# Patient Record
Sex: Female | Born: 1935 | Race: Black or African American | Hispanic: No | Smoking: Never smoker
Health system: Southern US, Community
[De-identification: ages and names within clinical notes are randomized; demographics above are authoritative.]

## PROBLEM LIST (undated history)

## (undated) DIAGNOSIS — I10 Essential (primary) hypertension: Secondary | ICD-10-CM

---

## 2001-05-01 ENCOUNTER — Emergency Department (HOSPITAL_COMMUNITY): Admission: EM | Admit: 2001-05-01 | Discharge: 2001-05-01 | Payer: Self-pay

## 2001-05-26 ENCOUNTER — Encounter: Admission: RE | Admit: 2001-05-26 | Discharge: 2001-06-25 | Payer: Self-pay | Admitting: Orthopedic Surgery

## 2010-06-25 ENCOUNTER — Emergency Department (HOSPITAL_COMMUNITY)
Admission: EM | Admit: 2010-06-25 | Discharge: 2010-06-25 | Disposition: A | Discharge status: Home / Self Care | Payer: Medicare Other | Attending: Emergency Medicine | Admitting: Emergency Medicine

## 2010-06-25 DIAGNOSIS — B029 Zoster without complications: Secondary | ICD-10-CM | POA: Insufficient documentation

## 2010-06-25 DIAGNOSIS — I10 Essential (primary) hypertension: Secondary | ICD-10-CM | POA: Insufficient documentation

## 2010-06-25 DIAGNOSIS — R220 Localized swelling, mass and lump, head: Secondary | ICD-10-CM | POA: Insufficient documentation

## 2013-02-14 ENCOUNTER — Other Ambulatory Visit: Payer: Self-pay

## 2013-02-14 ENCOUNTER — Emergency Department (HOSPITAL_COMMUNITY): Payer: Medicare Other

## 2013-02-14 ENCOUNTER — Encounter (HOSPITAL_COMMUNITY): Payer: Self-pay | Admitting: Emergency Medicine

## 2013-02-14 ENCOUNTER — Inpatient Hospital Stay (HOSPITAL_COMMUNITY)
Admission: EM | Admit: 2013-02-14 | Discharge: 2013-03-05 | DRG: 871 | Disposition: E | Payer: Medicare Other | Attending: Internal Medicine | Admitting: Internal Medicine

## 2013-02-14 DIAGNOSIS — I509 Heart failure, unspecified: Secondary | ICD-10-CM

## 2013-02-14 DIAGNOSIS — J9600 Acute respiratory failure, unspecified whether with hypoxia or hypercapnia: Secondary | ICD-10-CM | POA: Diagnosis present

## 2013-02-14 DIAGNOSIS — J189 Pneumonia, unspecified organism: Secondary | ICD-10-CM | POA: Diagnosis present

## 2013-02-14 DIAGNOSIS — B9789 Other viral agents as the cause of diseases classified elsewhere: Secondary | ICD-10-CM | POA: Diagnosis present

## 2013-02-14 DIAGNOSIS — E43 Unspecified severe protein-calorie malnutrition: Secondary | ICD-10-CM | POA: Insufficient documentation

## 2013-02-14 DIAGNOSIS — N390 Urinary tract infection, site not specified: Secondary | ICD-10-CM | POA: Diagnosis present

## 2013-02-14 DIAGNOSIS — I498 Other specified cardiac arrhythmias: Secondary | ICD-10-CM | POA: Diagnosis present

## 2013-02-14 DIAGNOSIS — J9601 Acute respiratory failure with hypoxia: Secondary | ICD-10-CM

## 2013-02-14 DIAGNOSIS — Z66 Do not resuscitate: Secondary | ICD-10-CM | POA: Diagnosis not present

## 2013-02-14 DIAGNOSIS — I214 Non-ST elevation (NSTEMI) myocardial infarction: Secondary | ICD-10-CM

## 2013-02-14 DIAGNOSIS — B999 Unspecified infectious disease: Secondary | ICD-10-CM | POA: Diagnosis present

## 2013-02-14 DIAGNOSIS — E860 Dehydration: Secondary | ICD-10-CM | POA: Diagnosis present

## 2013-02-14 DIAGNOSIS — IMO0002 Reserved for concepts with insufficient information to code with codable children: Secondary | ICD-10-CM

## 2013-02-14 DIAGNOSIS — I10 Essential (primary) hypertension: Secondary | ICD-10-CM | POA: Diagnosis present

## 2013-02-14 DIAGNOSIS — Q211 Atrial septal defect: Secondary | ICD-10-CM

## 2013-02-14 DIAGNOSIS — T502X5A Adverse effect of carbonic-anhydrase inhibitors, benzothiadiazides and other diuretics, initial encounter: Secondary | ICD-10-CM | POA: Diagnosis present

## 2013-02-14 DIAGNOSIS — Q2111 Secundum atrial septal defect: Secondary | ICD-10-CM

## 2013-02-14 DIAGNOSIS — Z515 Encounter for palliative care: Secondary | ICD-10-CM

## 2013-02-14 DIAGNOSIS — A419 Sepsis, unspecified organism: Principal | ICD-10-CM | POA: Diagnosis present

## 2013-02-14 DIAGNOSIS — F29 Unspecified psychosis not due to a substance or known physiological condition: Secondary | ICD-10-CM | POA: Diagnosis present

## 2013-02-14 DIAGNOSIS — T40605A Adverse effect of unspecified narcotics, initial encounter: Secondary | ICD-10-CM | POA: Diagnosis present

## 2013-02-14 DIAGNOSIS — Z7982 Long term (current) use of aspirin: Secondary | ICD-10-CM

## 2013-02-14 DIAGNOSIS — Z79899 Other long term (current) drug therapy: Secondary | ICD-10-CM

## 2013-02-14 DIAGNOSIS — R000 Tachycardia, unspecified: Secondary | ICD-10-CM | POA: Diagnosis present

## 2013-02-14 DIAGNOSIS — F411 Generalized anxiety disorder: Secondary | ICD-10-CM | POA: Diagnosis present

## 2013-02-14 DIAGNOSIS — N179 Acute kidney failure, unspecified: Secondary | ICD-10-CM

## 2013-02-14 DIAGNOSIS — R0682 Tachypnea, not elsewhere classified: Secondary | ICD-10-CM | POA: Diagnosis present

## 2013-02-14 HISTORY — DX: Essential (primary) hypertension: I10

## 2013-02-14 LAB — CBC WITH DIFFERENTIAL/PLATELET
Basophils Absolute: 0.1 10*3/uL (ref 0.0–0.1)
Eosinophils Relative: 3 % (ref 0–5)
Lymphocytes Relative: 20 % (ref 12–46)
Lymphs Abs: 2.8 10*3/uL (ref 0.7–4.0)
MCV: 97.2 fL (ref 78.0–100.0)
Monocytes Relative: 16 % — ABNORMAL HIGH (ref 3–12)
Neutrophils Relative %: 61 % (ref 43–77)
Platelets: 379 10*3/uL (ref 150–400)
RBC: 3.93 MIL/uL (ref 3.87–5.11)
RDW: 14.3 % (ref 11.5–15.5)
WBC: 13.7 10*3/uL — ABNORMAL HIGH (ref 4.0–10.5)

## 2013-02-14 LAB — COMPREHENSIVE METABOLIC PANEL
ALT: 7 U/L (ref 0–35)
AST: 20 U/L (ref 0–37)
Alkaline Phosphatase: 71 U/L (ref 39–117)
BUN: 16 mg/dL (ref 6–23)
CO2: 23 mEq/L (ref 19–32)
Calcium: 8.5 mg/dL (ref 8.4–10.5)
Creatinine, Ser: 1.18 mg/dL — ABNORMAL HIGH (ref 0.50–1.10)
GFR calc Af Amer: 46 mL/min — ABNORMAL LOW (ref >90)
GFR calc non Af Amer: 40 mL/min — ABNORMAL LOW (ref >90)
Glucose, Bld: 145 mg/dL — ABNORMAL HIGH (ref 70–99)
Potassium: 4.3 mEq/L (ref 3.5–5.1)
Sodium: 137 mEq/L (ref 135–145)
Total Bilirubin: 0.3 mg/dL (ref 0.3–1.2)
Total Protein: 6.5 g/dL (ref 6.0–8.3)

## 2013-02-14 LAB — POCT I-STAT TROPONIN I: Troponin i, poc: 0.01 ng/mL (ref 0.00–0.08)

## 2013-02-14 LAB — PROCALCITONIN: Procalcitonin: 0.15 ng/mL

## 2013-02-14 MED ORDER — ACETAMINOPHEN 325 MG PO TABS
650.0000 mg | ORAL_TABLET | Freq: Four times a day (QID) | ORAL | Status: DC | PRN
  Administered 2013-02-16: 650 mg via ORAL
  Filled 2013-02-14: qty 2

## 2013-02-14 MED ORDER — LEVALBUTEROL HCL 0.63 MG/3ML IN NEBU
0.6300 mg | INHALATION_SOLUTION | Freq: Four times a day (QID) | RESPIRATORY_TRACT | Status: DC | PRN
  Administered 2013-02-17 – 2013-02-25 (×4): 0.63 mg via RESPIRATORY_TRACT
  Filled 2013-02-14 (×4): qty 3

## 2013-02-14 MED ORDER — ADULT MULTIVITAMIN W/MINERALS CH
1.0000 | ORAL_TABLET | Freq: Every day | ORAL | Status: DC
  Administered 2013-02-15 – 2013-02-25 (×10): 1 via ORAL
  Filled 2013-02-14 (×14): qty 1

## 2013-02-14 MED ORDER — SODIUM CHLORIDE 0.9 % IJ SOLN
3.0000 mL | Freq: Two times a day (BID) | INTRAMUSCULAR | Status: DC
  Administered 2013-02-16 – 2013-02-26 (×11): 3 mL via INTRAVENOUS

## 2013-02-14 MED ORDER — ASPIRIN EC 81 MG PO TBEC
81.0000 mg | DELAYED_RELEASE_TABLET | Freq: Every day | ORAL | Status: DC
  Administered 2013-02-15 – 2013-02-16 (×2): 81 mg via ORAL
  Filled 2013-02-14 (×3): qty 1

## 2013-02-14 MED ORDER — ATENOLOL 100 MG PO TABS
100.0000 mg | ORAL_TABLET | Freq: Every day | ORAL | Status: DC
  Administered 2013-02-15 – 2013-02-16 (×2): 100 mg via ORAL
  Filled 2013-02-14 (×4): qty 1

## 2013-02-14 MED ORDER — SODIUM CHLORIDE 0.9 % IV SOLN
INTRAVENOUS | Status: AC
  Administered 2013-02-14: 23:00:00 via INTRAVENOUS

## 2013-02-14 MED ORDER — ACETAMINOPHEN 650 MG RE SUPP: 650.0000 mg | Freq: Four times a day (QID) | RECTAL | Status: DC | PRN

## 2013-02-14 MED ORDER — ACETAMINOPHEN 500 MG PO TABS
1000.0000 mg | ORAL_TABLET | Freq: Once | ORAL | Status: AC
  Administered 2013-02-14: 1000 mg via ORAL
  Filled 2013-02-14: qty 2

## 2013-02-14 MED ORDER — SODIUM CHLORIDE 0.9 % IV BOLUS (SEPSIS)
1000.0000 mL | Freq: Once | INTRAVENOUS | Status: AC
  Administered 2013-02-14: 1000 mL via INTRAVENOUS

## 2013-02-14 MED ORDER — ONDANSETRON HCL 4 MG/2ML IJ SOLN: 4.0000 mg | Freq: Four times a day (QID) | INTRAMUSCULAR | Status: DC | PRN

## 2013-02-14 MED ORDER — ONDANSETRON HCL 4 MG PO TABS: 4.0000 mg | ORAL_TABLET | Freq: Four times a day (QID) | ORAL | Status: DC | PRN

## 2013-02-14 MED ORDER — LEVOFLOXACIN IN D5W 750 MG/150ML IV SOLN
750.0000 mg | Freq: Once | INTRAVENOUS | Status: AC
  Administered 2013-02-14: 750 mg via INTRAVENOUS
  Filled 2013-02-14: qty 150

## 2013-02-14 MED ORDER — GUAIFENESIN-DM 100-10 MG/5ML PO SYRP: 5.0000 mL | ORAL_SOLUTION | ORAL | Status: DC | PRN

## 2013-02-14 MED ORDER — ENOXAPARIN SODIUM 40 MG/0.4ML ~~LOC~~ SOLN
40.0000 mg | Freq: Every day | SUBCUTANEOUS | Status: DC
  Administered 2013-02-14 – 2013-02-16 (×3): 40 mg via SUBCUTANEOUS
  Filled 2013-02-14 (×4): qty 0.4

## 2013-02-14 MED ORDER — GUAIFENESIN ER 600 MG PO TB12
600.0000 mg | ORAL_TABLET | Freq: Two times a day (BID) | ORAL | Status: DC
  Administered 2013-02-14 – 2013-02-23 (×17): 600 mg via ORAL
  Filled 2013-02-14 (×20): qty 1

## 2013-02-14 NOTE — ED Notes (Signed)
Pt arrived to ED with Shortness of breath.  Pt had gone to the Doctor Thursday who states she had a viral infection and was sent home.  Pt states that last night her breathing became difficult.  Pt has chest congestion.  Pt is tachycardic in triage and has difficulty mainting an appropriate O2 saturation rate.  Pt was place on O2

## 2013-02-14 NOTE — ED Notes (Signed)
Pt unable to void at this time. 

## 2013-02-14 NOTE — ED Provider Notes (Signed)
CSN: [id number]     Arrival date & time 02/06/2013  1926 History   First MD Initiated Contact with Patient 02/23/2013 1948     Chief Complaint  Patient presents with  . Shortness of Breath   (Consider location/radiation/quality/duration/timing/severity/associated sxs/prior Treatment) The history is provided by the patient.  Alicia Yoder is a 79 y.o. female hx of HTN, pneumonia here with shortness of breath. Shortness of breath for the last 2 days. She saw PMD 2 days ago was told that she has a viral infection was sent home. Since yesterday she had worsening trouble breathing and was prescribed something for cough. She was noted to be tachypneic and hypoxic in triage and sent here on oxygen.    History reviewed. No pertinent past medical history. History reviewed. No pertinent past surgical history. History reviewed. No pertinent family history. History  Substance Use Topics  . Smoking status: Never Smoker   . Smokeless tobacco: Not on file  . Alcohol Use: No   OB History   Grav Para Term Preterm Abortions TAB SAB Ect Mult Living                 Review of Systems  Respiratory: Positive for shortness of breath.   All other systems reviewed and are negative.    Allergies  Review of patient's allergies indicates no known allergies.  Home Medications  No current outpatient prescriptions on file. BP 152/69  Pulse 155  Temp(Src) 101.1 F (38.4 C) (Rectal)  Resp 22  SpO2 94% Physical Exam  Nursing note and vitals reviewed. Constitutional:  Chronically ill, tachypneic   HENT:  Head: Normocephalic.  Mouth/Throat: Oropharynx is clear and moist.  Eyes: Conjunctivae are normal. Pupils are equal, round, and reactive to light.  Neck: Normal range of motion. Neck supple.  Cardiovascular: Regular rhythm and normal heart sounds.   Tachycardic   Pulmonary/Chest:  Tachypneic, + L basilar crackles.   Abdominal: Soft. Bowel sounds are normal. She exhibits no distension. There is no  tenderness. There is no rebound and no guarding.  Musculoskeletal: Normal range of motion. She exhibits no edema.  Neurological: She is alert.  Skin: Skin is warm and dry.  Psychiatric: She has a normal mood and affect. Her behavior is normal. Judgment and thought content normal.    ED Course  Procedures (including critical care time)  CRITICAL CARE Performed by: Silverio Lay, Dewanda Fennema   Total critical care time: 30 min   Critical care time was exclusive of separately billable procedures and treating other patients.  Critical care was necessary to treat or prevent imminent or life-threatening deterioration.  Critical care was time spent personally by me on the following activities: development of treatment plan with patient and/or surrogate as well as nursing, discussions with consultants, evaluation of patient's response to treatment, examination of patient, obtaining history from patient or surrogate, ordering and performing treatments and interventions, ordering and review of laboratory studies, ordering and review of radiographic studies, pulse oximetry and re-evaluation of patient's condition.   Labs Review Labs Reviewed  CBC WITH DIFFERENTIAL - Abnormal; Notable for the following:    WBC 13.7 (*)    Neutro Abs 8.3 (*)    Monocytes Relative 16 (*)    Monocytes Absolute 2.1 (*)    All other components within normal limits  COMPREHENSIVE METABOLIC PANEL - Abnormal; Notable for the following:    Glucose, Bld 145 (*)    Creatinine, Ser 1.18 (*)    Albumin 2.2 (*)    GFR  calc non Af Amer 40 (*)    GFR calc Af Amer 46 (*)    All other components within normal limits  CG4 I-STAT (LACTIC ACID) - Abnormal; Notable for the following:    Lactic Acid, Venous 2.27 (*)    All other components within normal limits  CULTURE, BLOOD (ROUTINE X 2)  CULTURE, BLOOD (ROUTINE X 2)  URINALYSIS, ROUTINE W REFLEX MICROSCOPIC  INFLUENZA PANEL BY PCR  POCT I-STAT TROPONIN I   Imaging Review Dg Chest 2  View  02/05/2013   CLINICAL DATA:  Shortness of breath.  Chest pain.  EXAM: CHEST  2 VIEW  COMPARISON:  None.  FINDINGS: Diffuse bilateral airspace disease is seen, most likely due to diffuse pulmonary edema. Tiny bilateral pleural effusions are pleural thickening also noted. Thoracolumbar scoliosis also noted.  IMPRESSION: Diffuse bilateral airspace disease, likely due to diffuse pulmonary edema. Tiny bilateral pleural effusions versus pleural thickening.   Electronically Signed   By: Myles Rosenthal M.D.   On: 02/24/2013 20:42    EKG Interpretation    Date/Time:  Saturday February 14 2013 19:49:22 EST Ventricular Rate:  145 PR Interval:  176 QRS Duration: 64 QT Interval:  244 QTC Calculation: 379 R Axis:   88 Text Interpretation:  sinus tachycardia Multiform ventricular premature complexes Borderline right axis deviation Low voltage, extremity and precordial leads Nonspecific T abnrm, anterolateral leads Baseline wander in lead(s) II Confirmed by Luvern Mischke  MD, Josafat Enrico [id number]) on 02/18/2013 7:51:24 PM            MDM  No diagnosis found. Alicia Yoder is a 79 y.o. female here with SOB, cough, hypoxia. I think she likely has pneumonia. She is also tachycardic to 150s. Will do sepsis workup. Given hypoxia, will likely need admission.   9:15 PM Patient's xray showed pneumonia. WBC 14. Given levaquin. Will admit to step down given hypoxic and tachycardia. Never hypotensive. Will admit.     Richardean Canal, MD 02/18/2013 2116

## 2013-02-14 NOTE — ED Notes (Signed)
Pt arrived via triage, SOB and rapid heart rate

## 2013-02-14 NOTE — H&P (Addendum)
[facility]  History and Physical  Alicia Yoder ZOX:[id number] DOB: [birth year] DOA: 02/15/2013  Referring physician: EDP PCP: Lolita Patella, MD  Outpatient Specialists:  1. None  Chief Complaint: Difficulty breathing  HPI: Alicia Yoder is a 79 y.o. female with history of hypertension presented to the ED due to cough, fever and difficulty breathing. History is provided by patient and her niece at bedside. She apparently has been unwell since Thanksgiving when she developed a running nose, nonproductive cough, generalized weakness but no fever or difficulty breathing. She traveled to [place], [place] to visit family. Her symptoms apparently got worse 3-4 days ago where she was having chest congestion, unable to bring her sputum out and difficulty breathing. She was evaluated at her PCPs office with EKG and blood tests. She was told to have a vital illness and recommended symptomatic treatment with Mucinex and cough medications which she started 2 days ago. Yesterday she felt slightly better but by last night she started having worsening cough with clear sputum, difficulty breathing and fever of 99.35F. Her appetite is poor. She felt generally weak. In the ED, MAXIMUM TEMPERATURE 101.32F, oxygen saturation 84%, sinus tachycardia in the 130-150s, WBC 13.7, elevated lactate 2.27 and chest x-ray read as diffuse bilateral airspace disease likely do to diffuse pulmonary edema and tiny bilateral pleural effusions versus pleural thickening. Blood cultures x2 drawn, patient was given a dose of IV Levaquin and hospitalist admission requested.   Review of Systems: All systems reviewed and apart from history of presenting illness, are negative.  Past Medical History  Diagnosis Date  . HTN (hypertension)    History reviewed. No pertinent past surgical history. Social History:  reports that she has never smoked. She does not have any smokeless tobacco history on file. She reports that she does not  drink alcohol or use illicit drugs. Lives alone. Independent of activities of daily living.  No Known Allergies  History reviewed. No pertinent family history. negative family history.   Prior to Admission medications   Medication Sig Start Date End Date Taking? Authorizing Provider  aspirin EC 81 MG tablet Take 81 mg by mouth daily.   Yes Historical Provider, MD  atenolol (TENORMIN) 100 MG tablet Take 100 mg by mouth daily. 11/18/12  Yes Historical Provider, MD  Multiple Vitamin (MULTIVITAMIN WITH MINERALS) TABS tablet Take 1 tablet by mouth daily.   Yes Historical Provider, MD   Physical Exam: Filed Vitals:   02/18/2013 1928 02/24/2013 1934 02/24/2013 2011 02/03/2013 2125  BP: 152/69   132/62  Pulse: 155   133  Temp: 98.7 F (37.1 C)  101.1 F (38.4 C)   TempSrc: Oral  Rectal   Resp: 22   29  SpO2: 84% 94%  97%     General exam: Moderately built and nourished elderly female patient, lying comfortably supine on the gurney in no obvious distress.  Head, eyes and ENT: Nontraumatic and normocephalic. Pupils equally reacting to light and accommodation. Oral mucosa dry.  Neck: Supple. No JVD, carotid bruit or thyromegaly.  Lymphatics: No lymphadenopathy.  Respiratory system: Reduced breath sounds in the bases with bibasal coarse (? Chronic) crackles but otherwise rest of lung fields are clear to auscultation. No increased work of breathing. Able to speak in full sentences.  Cardiovascular system: S1 and S2 heard, regular tachycardia. No JVD, murmurs, gallops, clicks. Trace bilateral leg edema (chronic).  Gastrointestinal system: Abdomen is nondistended, soft and nontender. Normal bowel sounds heard. No organomegaly or masses appreciated.  Central nervous system: Alert  and oriented. No focal neurological deficits.  Extremities: Symmetric 5 x 5 power. Peripheral pulses symmetrically felt.   Skin: No rashes or acute findings.  Musculoskeletal system: Negative exam.  Psychiatry:  Pleasant and cooperative.   Labs on Admission:  Basic Metabolic Panel:  Recent Labs Lab 03/02/2013 1950  NA 137  K 4.3  CL 103  CO2 23  GLUCOSE 145*  BUN 16  CREATININE 1.18*  CALCIUM 8.5   Liver Function Tests:  Recent Labs Lab 03/04/2013 1950  AST 20  ALT 7  ALKPHOS 71  BILITOT 0.3  PROT 6.5  ALBUMIN 2.2*   No results found for this basename: LIPASE, AMYLASE,  in the last 168 hours No results found for this basename: AMMONIA,  in the last 168 hours CBC:  Recent Labs Lab 02/18/2013 1950  WBC 13.7*  NEUTROABS 8.3*  HGB 12.6  HCT 38.2  MCV 97.2  PLT 379   Cardiac Enzymes: No results found for this basename: CKTOTAL, CKMB, CKMBINDEX, TROPONINI,  in the last 168 hours  BNP (last 3 results) No results found for this basename: PROBNP,  in the last 8760 hours CBG: No results found for this basename: GLUCAP,  in the last 168 hours  Radiological Exams on Admission: Dg Chest 2 View  02/13/2013   CLINICAL DATA:  Shortness of breath.  Chest pain.  EXAM: CHEST  2 VIEW  COMPARISON:  None.  FINDINGS: Diffuse bilateral airspace disease is seen, most likely due to diffuse pulmonary edema. Tiny bilateral pleural effusions are pleural thickening also noted. Thoracolumbar scoliosis also noted.  IMPRESSION: Diffuse bilateral airspace disease, likely due to diffuse pulmonary edema. Tiny bilateral pleural effusions versus pleural thickening.   Electronically Signed   By: Myles Rosenthal M.D.   On: 02/27/2013 20:42    EKG: Independently reviewed. Sinus tachycardia at 145 beats per minute, normal axis, occasional PVCs, nonspecific T wave abnormalities. No acute changes.  Assessment/Plan Principal Problem:   Pneumonia, community acquired Active Problems:   Sepsis   Dehydration   Sinus tachycardia   CAP (community acquired pneumonia)   Possible community-acquired pneumonia - Admit to step down unit for close monitoring and management - Followup blood culture results. - Check  urine Legionella and streptococcal antigens. - Followup on influenza panel PCR. Until then continue droplet isolation. - IV fluids, continue IV levofloxacin and symptomatic treatment.  Sepsis - Secondary to pneumonia - Management as above. - Check pro calcitonin  Dehydration - Due to sepsis. Continue IV fluids  Sinus tachycardia  - Part of the sepsis syndrome and? Beta blocker withdrawal - Blood pressure is normal. Continue home atenolol in treatment for sepsis including IV fluids. Monitor closely  Hypertension - Reasonable control. Continue atenolol.     Code Status: Full  Family Communication: Discussed with niece at bedside.  Disposition Plan: Admit to step down. Home in medically stable.   Time spent: 65 minutes  Alicia Heinrichs, MD, FACP, FHM. [facility] Pager [phone]  If 7PM-7AM, please contact night-coverage [email or web address] Password [facility] 02/06/2013, 9:59 PM

## 2013-02-14 NOTE — ED Notes (Signed)
Notified EDP, Yao,MD. Pt. i-stat CG4 lactic acid results 2.27.

## 2013-02-15 ENCOUNTER — Inpatient Hospital Stay (HOSPITAL_COMMUNITY): Payer: Medicare Other

## 2013-02-15 DIAGNOSIS — I10 Essential (primary) hypertension: Secondary | ICD-10-CM

## 2013-02-15 LAB — CBC
HCT: 33.1 % — ABNORMAL LOW (ref 36.0–46.0)
Hemoglobin: 11 g/dL — ABNORMAL LOW (ref 12.0–15.0)
MCH: 32.4 pg (ref 26.0–34.0)
MCHC: 33.2 g/dL (ref 30.0–36.0)
Platelets: 350 10*3/uL (ref 150–400)
RDW: 14.2 % (ref 11.5–15.5)

## 2013-02-15 LAB — PRO B NATRIURETIC PEPTIDE: Pro B Natriuretic peptide (BNP): 1168 pg/mL — ABNORMAL HIGH (ref 0–450)

## 2013-02-15 LAB — URINALYSIS, ROUTINE W REFLEX MICROSCOPIC
Glucose, UA: NEGATIVE mg/dL
Hgb urine dipstick: NEGATIVE
Specific Gravity, Urine: 1.014 (ref 1.005–1.030)
pH: 5.5 (ref 5.0–8.0)

## 2013-02-15 LAB — INFLUENZA PANEL BY PCR (TYPE A & B)
H1N1 flu by pcr: NOT DETECTED
Influenza A By PCR: NEGATIVE
Influenza B By PCR: NEGATIVE

## 2013-02-15 LAB — BASIC METABOLIC PANEL
BUN: 15 mg/dL (ref 6–23)
Chloride: 106 mEq/L (ref 96–112)
Creatinine, Ser: 1.11 mg/dL — ABNORMAL HIGH (ref 0.50–1.10)
GFR calc Af Amer: 50 mL/min — ABNORMAL LOW (ref >90)
GFR calc non Af Amer: 43 mL/min — ABNORMAL LOW (ref >90)
Glucose, Bld: 102 mg/dL — ABNORMAL HIGH (ref 70–99)

## 2013-02-15 LAB — LACTIC ACID, PLASMA: Lactic Acid, Venous: 1 mmol/L (ref 0.5–2.2)

## 2013-02-15 LAB — URINE MICROSCOPIC-ADD ON

## 2013-02-15 MED ORDER — BIOTENE DRY MOUTH MT LIQD
15.0000 mL | Freq: Two times a day (BID) | OROMUCOSAL | Status: DC
  Administered 2013-02-16 – 2013-02-18 (×6): 15 mL via OROMUCOSAL

## 2013-02-15 MED ORDER — FUROSEMIDE 10 MG/ML IJ SOLN
40.0000 mg | Freq: Once | INTRAMUSCULAR | Status: AC
  Administered 2013-02-15: 40 mg via INTRAVENOUS
  Filled 2013-02-15: qty 4

## 2013-02-15 MED ORDER — LEVOFLOXACIN IN D5W 750 MG/150ML IV SOLN
750.0000 mg | INTRAVENOUS | Status: DC
  Administered 2013-02-16: 750 mg via INTRAVENOUS
  Filled 2013-02-15: qty 150

## 2013-02-15 NOTE — Progress Notes (Signed)
[facility] PROGRESS NOTE  Alicia Yoder NFA:[id number] DOB: [birth year] DOA: 01-Mar-2013 PCP: Lolita Patella, MD  Assessment/Plan: Principal Problem:   Sepsis - Improving currently on Levaquin - Continue to monitor closely  Active Problems:    Pneumonia, community acquired - Influenza panel/PCR pending order was placed by ED physician - Strep pneumococcus urinary antigen negative, legionella urinary antigen pending - Patient reports feeling better on current regimen as such will continue.  - Given tachypnea and tachycardia, especially with tachypnea of levels above 30 documented in the last 24 hours would like to continue to monitor patient closely and step down unit.    Dehydration - Improved and may be contributing to sinus tachycardia - Chest x-ray reported possibility of diffuse pulmonary edema will obtain BNP , although clinical picture of given fevers and upper respiratory symptoms most likely secondary to infectious etiology. At this point we will saline lock since patient is able to tolerate by mouth intake    Sinus tachycardia - Most likely secondary to fevers and current suspected infectious etiology  Code Status: full Family Communication: Discussed with patient and nieces at bedside Disposition Plan: With improvement in tachycardia and tachypnea transitioned to floor most likely within the next one to 2 days   Consultants:  None  Procedures:  None  Antibiotics:  Levaquin  HPI/Subjective: No new complaints. Patient states he feels better today. No acute issues reported overnight  Objective: Filed Vitals:   02/15/13 0800  BP:   Pulse:   Temp: 97.8 F (36.6 C)  Resp:     Intake/Output Summary (Last 24 hours) at 02/15/13 0907 Last data filed at 02/15/13 4696  Gross per 24 hour  Intake 1227.5 ml  Output    400 ml  Net  827.5 ml   Filed Weights   03/01/2013 2254 02/15/13 0400  Weight: 67.8 kg (149 lb 7.6 oz) 68.9 kg (151 lb 14.4 oz)     Exam:   General:  Patient in no acute distress, alert and awake  Cardiovascular: Regular rate and rhythm, no murmurs  Respiratory: No wheezes, increased respiratory rate  Abdomen: Soft, nondistended  Musculoskeletal: No cyanosis, no clubbing  Data Reviewed: Basic Metabolic Panel:  Recent Labs Lab 03/01/2013 1950 02/15/13 0359  NA 137 137  K 4.3 4.4  CL 103 106  CO2 23 23  GLUCOSE 145* 102*  BUN 16 15  CREATININE 1.18* 1.11*  CALCIUM 8.5 8.0*   Liver Function Tests:  Recent Labs Lab 03-01-13 1950  AST 20  ALT 7  ALKPHOS 71  BILITOT 0.3  PROT 6.5  ALBUMIN 2.2*   No results found for this basename: LIPASE, AMYLASE,  in the last 168 hours No results found for this basename: AMMONIA,  in the last 168 hours CBC:  Recent Labs Lab 2013-03-01 1950 02/15/13 0359  WBC 13.7* 10.9*  NEUTROABS 8.3*  --   HGB 12.6 11.0*  HCT 38.2 33.1*  MCV 97.2 97.4  PLT 379 350   Cardiac Enzymes: No results found for this basename: CKTOTAL, CKMB, CKMBINDEX, TROPONINI,  in the last 168 hours BNP (last 3 results) No results found for this basename: PROBNP,  in the last 8760 hours CBG: No results found for this basename: GLUCAP,  in the last 168 hours  Recent Results (from the past 240 hour(s))  MRSA PCR SCREENING     Status: None   Collection Time    2013/03/01 11:00 PM      Result Value Range Status   MRSA by PCR  NEGATIVE  NEGATIVE Final   Comment:            The GeneXpert MRSA Assay (FDA     approved for NASAL specimens     only), is one component of a     comprehensive MRSA colonization     surveillance program. It is not     intended to diagnose MRSA     infection nor to guide or     monitor treatment for     MRSA infections.     Studies: Dg Chest 2 View  02/08/2013   CLINICAL DATA:  Shortness of breath.  Chest pain.  EXAM: CHEST  2 VIEW  COMPARISON:  None.  FINDINGS: Diffuse bilateral airspace disease is seen, most likely due to diffuse pulmonary edema. Tiny  bilateral pleural effusions are pleural thickening also noted. Thoracolumbar scoliosis also noted.  IMPRESSION: Diffuse bilateral airspace disease, likely due to diffuse pulmonary edema. Tiny bilateral pleural effusions versus pleural thickening.   Electronically Signed   By: Myles Rosenthal M.D.   On: 02/21/2013 20:42    Scheduled Meds: . [START ON 02/16/2013] antiseptic oral rinse  15 mL Mouth Rinse BID  . aspirin EC  81 mg Oral Daily  . atenolol  100 mg Oral Daily  . enoxaparin (LOVENOX) injection  40 mg Subcutaneous QHS  . guaiFENesin  600 mg Oral BID  . [START ON 02/16/2013] levofloxacin (LEVAQUIN) IV  750 mg Intravenous Q48H  . multivitamin with minerals  1 tablet Oral Daily  . sodium chloride  3 mL Intravenous Q12H   Continuous Infusions: . sodium chloride 150 mL/hr at 02/16/2013 2313    Time spent: > 35 minutes    Penny Pia  [facility] Pager [phone] If 7PM-7AM, please contact night-coverage at [email or web address], password [facility] 02/15/2013, 9:07 AM  LOS: 1 day

## 2013-02-15 NOTE — Progress Notes (Signed)
ANTIBIOTIC CONSULT NOTE - INITIAL  Pharmacy Consult for Levofloxacin Indication: rule out pneumonia  No Known Allergies  Patient Measurements: Height: 5\' 6"  (167.6 cm) Weight: 149 lb 7.6 oz (67.8 kg) IBW/kg (Calculated) : 59.3 Adjusted Body Weight:   Vital Signs: Temp: 98.2 F (36.8 C) (12/14 0000) Temp src: Oral (12/14 0000) BP: 135/60 mmHg (12/14 0000) Pulse Rate: 124 (12/14 0000) Intake/Output from previous day: 12/13 0701 - 12/14 0700 In: 177.5 [P.O.:60; I.V.:117.5] Out: -  Intake/Output from this shift: Total I/O In: 177.5 [P.O.:60; I.V.:117.5] Out: -   Labs:  Recent Labs  02/11/2013 1950  WBC 13.7*  HGB 12.6  PLT 379  CREATININE 1.18*   Estimated Creatinine Clearance: 30.9 ml/min (by C-G formula based on Cr of 1.18). No results found for this basename: VANCOTROUGH, Leodis Binet, VANCORANDOM, GENTTROUGH, GENTPEAK, GENTRANDOM, TOBRATROUGH, TOBRAPEAK, TOBRARND, AMIKACINPEAK, AMIKACINTROU, AMIKACIN,  in the last 72 hours   Microbiology: Recent Results (from the past 720 hour(s))  MRSA PCR SCREENING     Status: None   Collection Time    02/08/2013 11:00 PM      Result Value Range Status   MRSA by PCR NEGATIVE  NEGATIVE Final   Comment:            The GeneXpert MRSA Assay (FDA     approved for NASAL specimens     only), is one component of a     comprehensive MRSA colonization     surveillance program. It is not     intended to diagnose MRSA     infection nor to guide or     monitor treatment for     MRSA infections.    Medical History: Past Medical History  Diagnosis Date  . HTN (hypertension)     Medications:  Anti-infectives   Start     Dose/Rate Route Frequency Ordered Stop   02/16/13 2000  levofloxacin (LEVAQUIN) IVPB 750 mg     750 mg 100 mL/hr over 90 Minutes Intravenous Every 48 hours 02/15/13 0201     02/25/2013 2045  levofloxacin (LEVAQUIN) IVPB 750 mg     750 mg 100 mL/hr over 90 Minutes Intravenous  Once 02/17/2013 2034 03/02/2013 2221      Assessment: Patient with possible CAP.  Renal function is poor at ~30ml/min.  Goal of Therapy:  Levofloxacin dosed based on patient weight and renal function   Plan:  Follow up culture results Levofloxacin 750mg  iv q48hr      Aleene Davidson Crowford 02/15/2013,2:01 AM

## 2013-02-15 NOTE — Progress Notes (Signed)
Dr. Cena Benton made aware pt's O2 sat 89-91%, oxygen has been increased from 2L to 4L via [place], crackles heard in bases bilaterally w/ Left greater than right, pt appears SOB.  Chest x-ray, BNP, Lasix ordered.  RN continuing to monitor.

## 2013-02-15 NOTE — Progress Notes (Signed)
Dr. Cena Benton made aware of chest x-ray and BNP results.  RN continuing to monitor.

## 2013-02-16 DIAGNOSIS — I509 Heart failure, unspecified: Secondary | ICD-10-CM

## 2013-02-16 LAB — BLOOD GAS, ARTERIAL
Acid-Base Excess: 3 mmol/L — ABNORMAL HIGH (ref 0.0–2.0)
Bicarbonate: 24.9 mEq/L — ABNORMAL HIGH (ref 20.0–24.0)
Drawn by: 11249
FIO2: 35 %
O2 Saturation: 82 %
Patient temperature: 102.4
TCO2: 22.2 mmol/L (ref 0–100)
pCO2 arterial: 33.2 mmHg — ABNORMAL LOW (ref 35.0–45.0)
pH, Arterial: 7.496 — ABNORMAL HIGH (ref 7.350–7.450)
pO2, Arterial: 50.3 mmHg — ABNORMAL LOW (ref 80.0–100.0)

## 2013-02-16 LAB — LEGIONELLA ANTIGEN, URINE

## 2013-02-16 LAB — URINE CULTURE
Colony Count: NO GROWTH
Culture: NO GROWTH

## 2013-02-16 MED ORDER — FUROSEMIDE 10 MG/ML IJ SOLN: 40.0000 mg | Freq: Once | INTRAMUSCULAR | Status: DC

## 2013-02-16 MED ORDER — PNEUMOCOCCAL VAC POLYVALENT 25 MCG/0.5ML IJ INJ
0.5000 mL | INJECTION | Freq: Once | INTRAMUSCULAR | Status: AC
  Administered 2013-02-16: 0.5 mL via INTRAMUSCULAR
  Filled 2013-02-16: qty 0.5

## 2013-02-16 MED ORDER — INFLUENZA VAC SPLIT QUAD 0.5 ML IM SUSP
0.5000 mL | Freq: Once | INTRAMUSCULAR | Status: AC
  Administered 2013-02-16: 0.5 mL via INTRAMUSCULAR
  Filled 2013-02-16: qty 0.5

## 2013-02-16 MED ORDER — FUROSEMIDE 10 MG/ML IJ SOLN
60.0000 mg | Freq: Once | INTRAMUSCULAR | Status: AC
  Administered 2013-02-16: 60 mg via INTRAVENOUS
  Filled 2013-02-16: qty 6

## 2013-02-16 MED ORDER — VANCOMYCIN HCL IN DEXTROSE 1-5 GM/200ML-% IV SOLN
1000.0000 mg | INTRAVENOUS | Status: DC
  Administered 2013-02-16: 1000 mg via INTRAVENOUS
  Filled 2013-02-16 (×2): qty 200

## 2013-02-16 MED ORDER — FUROSEMIDE 10 MG/ML IJ SOLN
40.0000 mg | Freq: Two times a day (BID) | INTRAMUSCULAR | Status: DC
  Administered 2013-02-16 – 2013-02-17 (×2): 40 mg via INTRAVENOUS
  Filled 2013-02-16 (×5): qty 4

## 2013-02-16 NOTE — Progress Notes (Signed)
40mg  Lasiks given 21:52, monitoring breathing, pt still tachypnic at 23:00, abdominal breathing. Advised NP, K Schorr, order given for ABG.

## 2013-02-16 NOTE — Progress Notes (Signed)
[facility] PROGRESS NOTE  Alicia Yoder ZOX:[id number] DOB: 04/26/1922 DOA: March 15, 2013 PCP: Lolita Patella, MD  Assessment/Plan: Principal Problem:   Sepsis - Given increased work of breathing overnight will expand antibiotic coverage to levaquin and vancomycin - Continue to monitor closely in Stepdown  Active Problems:    Pneumonia, community acquired  - Influenza pane: negative - Strep pneumococcus urinary antigen negative, legionella urinary antigen pending - Patient reports feeling better on current regimen as such will continue.  - Given tachypnea and tachycardia, especially with tachypnea of levels above 30 documented in the last 24 hours would like to continue to monitor patient closely and step down unit.  Acute CHF exacerbation - Chest x-ray reported possibility of diffuse pulmonary edema and BNP elevated > 1000 , although clinical picture of given fevers and upper respiratory symptoms also suspicious for infectious etiology.  - strict i/o's, daily weights, fluid restriction - Lasix - etiology uncertain. Plan will be Echocardiogram with improvement in condition.    Sinus tachycardia - Most likely secondary to fevers and current suspected infectious etiology - This morning on monitor suspicious for irregular HR although there was much artifact.  As such will obtain stat EKG. If patient has A fib will plan on starting anticoagulation with heparin.  Code Status: full Family Communication: Discussed with patient and nieces at bedside Disposition Plan: With improvement in tachycardia and tachypnea transitioned to floor most likely within the next one to 2 days   Consultants:  None  Procedures:  None  Antibiotics:  Levaquin  HPI/Subjective: Patient feels better today. Reports that she had lots of diuresis overnight after getting lasix.  Objective: Filed Vitals:   02/16/13 0400  BP:   Pulse: 34  Temp: 98 F (36.7 C)  Resp: 46     Intake/Output Summary (Last 24 hours) at 02/16/13 0831 Last data filed at 02/16/13 0230  Gross per 24 hour  Intake 1377.5 ml  Output   3101 ml  Net -1723.5 ml   Filed Weights   03-15-2013 2254 02/15/13 0400 02/16/13 0400  Weight: 67.8 kg (149 lb 7.6 oz) 68.9 kg (151 lb 14.4 oz) 67.9 kg (149 lb 11.1 oz)    Exam:   General:  Patient in no acute distress, alert and awake  Cardiovascular: Regular rate and rhythm, no murmurs  Respiratory: No wheezes, increased respiratory rate  Abdomen: Soft, nondistended  Musculoskeletal: No cyanosis, no clubbing  Data Reviewed: Basic Metabolic Panel:  Recent Labs Lab 03-15-2013 1950 02/15/13 0359  NA 137 137  K 4.3 4.4  CL 103 106  CO2 23 23  GLUCOSE 145* 102*  BUN 16 15  CREATININE 1.18* 1.11*  CALCIUM 8.5 8.0*   Liver Function Tests:  Recent Labs Lab 2013-03-15 1950  AST 20  ALT 7  ALKPHOS 71  BILITOT 0.3  PROT 6.5  ALBUMIN 2.2*   No results found for this basename: LIPASE, AMYLASE,  in the last 168 hours No results found for this basename: AMMONIA,  in the last 168 hours CBC:  Recent Labs Lab 03-15-13 1950 02/15/13 0359  WBC 13.7* 10.9*  NEUTROABS 8.3*  --   HGB 12.6 11.0*  HCT 38.2 33.1*  MCV 97.2 97.4  PLT 379 350   Cardiac Enzymes: No results found for this basename: CKTOTAL, CKMB, CKMBINDEX, TROPONINI,  in the last 168 hours BNP (last 3 results)  Recent Labs  02/15/13 0359  PROBNP 1168.0*   CBG: No results found for this basename: GLUCAP,  in the last 168 hours  Recent Results (from the past 240 hour(s))  MRSA PCR SCREENING     Status: None   Collection Time    02/21/2013 11:00 PM      Result Value Range Status   MRSA by PCR NEGATIVE  NEGATIVE Final   Comment:            The GeneXpert MRSA Assay (FDA     approved for NASAL specimens     only), is one component of a     comprehensive MRSA colonization     surveillance program. It is not     intended to diagnose MRSA     infection nor to  guide or     monitor treatment for     MRSA infections.     Studies: Dg Chest 2 View  02/26/2013   CLINICAL DATA:  Shortness of breath.  Chest pain.  EXAM: CHEST  2 VIEW  COMPARISON:  None.  FINDINGS: Diffuse bilateral airspace disease is seen, most likely due to diffuse pulmonary edema. Tiny bilateral pleural effusions are pleural thickening also noted. Thoracolumbar scoliosis also noted.  IMPRESSION: Diffuse bilateral airspace disease, likely due to diffuse pulmonary edema. Tiny bilateral pleural effusions versus pleural thickening.   Electronically Signed   By: Myles Rosenthal M.D.   On: 02/24/2013 20:42   Dg Chest Port 1 View  02/15/2013   CLINICAL DATA:  Shortness of breath history of hypertension  EXAM: PORTABLE CHEST - 1 VIEW  COMPARISON:  02/19/2013  FINDINGS: Diffuse bilateral pulmonary opacities appreciated mid radius confluence in the left lung base. There is blunting of the right and left costophrenic angles. When compared to the previous study there has been diffuse increase conspicuity of the pulmonary opacities. There is a more could confluent consolidative component in the left hilar region. S-shaped scoliosis identified within the thoracolumbar spine. Degenerative changes appreciated within the right lobe shoulders. Cardiac silhouette is partially obscured though enlarged.  IMPRESSION: Diffuse bilateral pulmonary opacities increased in severity when compared to previous study. These findings likely represent increased pulmonary edema. An accompanying infectious or inflammatory infiltrate cannot be excluded clinically appropriate. There are also findings likely reflecting small bilateral effusions left greater than right. Continued surveillance evaluation recommended.   Electronically Signed   By: Salome Holmes M.D.   On: 02/15/2013 17:16    Scheduled Meds: . antiseptic oral rinse  15 mL Mouth Rinse BID  . aspirin EC  81 mg Oral Daily  . atenolol  100 mg Oral Daily  . enoxaparin  (LOVENOX) injection  40 mg Subcutaneous QHS  . furosemide  40 mg Intravenous BID  . furosemide  60 mg Intravenous Once  . guaiFENesin  600 mg Oral BID  . influenza vac split quadrivalent PF  0.5 mL Intramuscular Once  . levofloxacin (LEVAQUIN) IV  750 mg Intravenous Q48H  . multivitamin with minerals  1 tablet Oral Daily  . pneumococcal 23 valent vaccine  0.5 mL Intramuscular Once  . sodium chloride  3 mL Intravenous Q12H   Continuous Infusions:    Time spent: > 35 minutes of critical care time.    Penny Pia  [facility] Pager [phone] If 7PM-7AM, please contact night-coverage at [email or web address], password [facility] 02/16/2013, 8:31 AM  LOS: 2 days

## 2013-02-16 NOTE — Progress Notes (Signed)
CARE MANAGEMENT NOTE 02/16/2013  Patient:  Alicia Yoder, Alicia Yoder   Account Number:  [id number]  Date Initiated:  02/16/2013  Documentation initiated by:  DAVIS,RHONDA  Subjective/Objective Assessment:   pt admitted due to increased wob and hypoxia     Action/Plan:   home when stable   Anticipated DC Date:  02/19/2013   Anticipated DC Plan:  HOME/SELF CARE  In-house referral  NA      DC Planning Services  NA      [facility] Choice  NA   Choice offered to / List presented to:  NA   DME arranged  NA      DME agency  NA     HH arranged  NA      HH agency  NA   Status of service:  In process, will continue to follow Medicare Important Message given?  NA - LOS <3 / Initial given by admissions (If response is "NO", the following Medicare IM given date fields will be blank) Date Medicare IM given:   Date Additional Medicare IM given:    Discharge Disposition:    Per UR Regulation:  Reviewed for med. necessity/level of care/duration of stay  If discussed at Long Length of Stay Meetings, dates discussed:    Comments:  [id number]/Rhonda Stark Jock, BSN, [place] [phone] Chart Reviewed for discharge and hospital needs. Discharge needs at time of review:  None present will follow for needs. Review of patient progress due on [id number].

## 2013-02-16 NOTE — Progress Notes (Signed)
ANTIBIOTIC CONSULT NOTE - INITIAL  Pharmacy Consult for Vancomycin Indication: pneumonia  No Known Allergies  Patient Measurements: Height: 5\' 6"  (167.6 cm) Weight: 149 lb 11.1 oz (67.9 kg) IBW/kg (Calculated) : 59.3  Labs:  Recent Labs  February 22, 2013 1950 02/15/13 0359  WBC 13.7* 10.9*  HGB 12.6 11.0*  PLT 379 350  CREATININE 1.18* 1.11*    Medical History: Past Medical History  Diagnosis Date  . HTN (hypertension)     Assessment: 47 yoF presented 12/13 with SOB. CXR with diffuse airspace dz (2/2 pulmonary edema). Levaquin start for r/o PNA. CXR 12/14 with increased bilateral pulmonary opacities, cannot r/o infectious process and pt with incr WOB - MD adding Vancomycin for suspected PNA.   12/13 >> Levaquin >>  12/15 >> Vanc >>   Tmax: 100.5 WBCs: improved, 10.9K Renal: Scr 1.11, CG 33, N 40 PCT 12/15 = 1.41 (up from 0.15 on 12/13)  12/13 blood x 2 >> NGTD 12/13 MRSA PCR >> negative 12/14 influenza panel >> negative 12/14 urine >> in process (UA w bacteria) 12/14 legionella/strep pneumo ag >> pending/negative  Today is D#3 Levaquin   Goal of Therapy:  Vancomycin trough level 15-20 mcg/ml  Plan:   Vancomycin 1g IV q24h  Continue Levaquin 750 mg IV q48h  Change Levaquin to PO when able  Geoffry Paradise, PharmD, BCPS Pager: [phone] 8:48 AM Pharmacy #: [id number]

## 2013-02-17 ENCOUNTER — Inpatient Hospital Stay (HOSPITAL_COMMUNITY): Payer: Medicare Other

## 2013-02-17 DIAGNOSIS — I369 Nonrheumatic tricuspid valve disorder, unspecified: Secondary | ICD-10-CM

## 2013-02-17 DIAGNOSIS — N179 Acute kidney failure, unspecified: Secondary | ICD-10-CM

## 2013-02-17 DIAGNOSIS — J9600 Acute respiratory failure, unspecified whether with hypoxia or hypercapnia: Secondary | ICD-10-CM

## 2013-02-17 LAB — BLOOD GAS, ARTERIAL
Acid-Base Excess: 2.9 mmol/L — ABNORMAL HIGH (ref 0.0–2.0)
Acid-base deficit: 0.6 mmol/L (ref 0.0–2.0)
Bicarbonate: 25.8 mEq/L — ABNORMAL HIGH (ref 20.0–24.0)
Bicarbonate: 27 mEq/L — ABNORMAL HIGH (ref 20.0–24.0)
Bicarbonate: 23.7 mEq/L (ref 20.0–24.0)
Drawn by: 331471
Drawn by: 331471
FIO2: 0.4 %
MECHVT: 420 mL
O2 Saturation: 84.8 %
O2 Saturation: 92.7 %
PEEP: 5 cmH2O
PEEP: 5 cmH2O
Patient temperature: 98.6
RATE: 14 resp/min
RATE: 20 resp/min
TCO2: 23.1 mmol/L (ref 0–100)
TCO2: 24.6 mmol/L (ref 0–100)
TCO2: 21.3 mmol/L (ref 0–100)
pCO2 arterial: 35.2 mmHg (ref 35.0–45.0)
pCO2 arterial: 60.1 mmHg (ref 35.0–45.0)
pH, Arterial: 7.479 — ABNORMAL HIGH (ref 7.350–7.450)
pH, Arterial: 7.276 — ABNORMAL LOW (ref 7.350–7.450)
pH, Arterial: 7.399 (ref 7.350–7.450)
pO2, Arterial: 48.7 mmHg — ABNORMAL LOW (ref 80.0–100.0)
pO2, Arterial: 359 mmHg — ABNORMAL HIGH (ref 80.0–100.0)
pO2, Arterial: 69.2 mmHg — ABNORMAL LOW (ref 80.0–100.0)

## 2013-02-17 LAB — BASIC METABOLIC PANEL
BUN: 21 mg/dL (ref 6–23)
BUN: 23 mg/dL (ref 6–23)
CO2: 24 mEq/L (ref 19–32)
CO2: 25 mEq/L (ref 19–32)
Calcium: 8.3 mg/dL — ABNORMAL LOW (ref 8.4–10.5)
Chloride: 98 mEq/L (ref 96–112)
Chloride: 98 mEq/L (ref 96–112)
Creatinine, Ser: 1.36 mg/dL — ABNORMAL HIGH (ref 0.50–1.10)
GFR calc Af Amer: 39 mL/min — ABNORMAL LOW (ref >90)
GFR calc non Af Amer: 31 mL/min — ABNORMAL LOW (ref >90)
Glucose, Bld: 195 mg/dL — ABNORMAL HIGH (ref 70–99)
Glucose, Bld: 245 mg/dL — ABNORMAL HIGH (ref 70–99)
Potassium: 3.7 mEq/L (ref 3.5–5.1)
Sodium: 132 mEq/L — ABNORMAL LOW (ref 135–145)

## 2013-02-17 LAB — CBC
HCT: 35 % — ABNORMAL LOW (ref 36.0–46.0)
Hemoglobin: 12 g/dL (ref 12.0–15.0)
MCH: 32.4 pg (ref 26.0–34.0)
MCHC: 34.3 g/dL (ref 30.0–36.0)
MCV: 94.6 fL (ref 78.0–100.0)
Platelets: 338 10*3/uL (ref 150–400)
RBC: 3.7 MIL/uL — ABNORMAL LOW (ref 3.87–5.11)
RDW: 13.9 % (ref 11.5–15.5)
WBC: 20.1 10*3/uL — ABNORMAL HIGH (ref 4.0–10.5)

## 2013-02-17 LAB — GLUCOSE, CAPILLARY: Glucose-Capillary: 192 mg/dL — ABNORMAL HIGH (ref 70–99)

## 2013-02-17 LAB — TROPONIN I: Troponin I: <0.3 ng/mL (ref <0.30)

## 2013-02-17 LAB — CREATININE, URINE, RANDOM: Creatinine, Urine: 27.32 mg/dL

## 2013-02-17 LAB — LACTIC ACID, PLASMA: Lactic Acid, Venous: 1.3 mmol/L (ref 0.5–2.2)

## 2013-02-17 LAB — UREA NITROGEN, URINE: Urea Nitrogen, Ur: 186 mg/dL

## 2013-02-17 LAB — SODIUM, URINE, RANDOM: Sodium, Ur: 115 mEq/L

## 2013-02-17 MED ORDER — SODIUM CHLORIDE 0.9 % IV BOLUS (SEPSIS)
250.0000 mL | Freq: Once | INTRAVENOUS | Status: AC
  Administered 2013-02-17: 250 mL via INTRAVENOUS

## 2013-02-17 MED ORDER — SODIUM CHLORIDE 0.9 % IV SOLN
INTRAVENOUS | Status: DC
  Administered 2013-02-18 – 2013-02-19 (×2): via INTRAVENOUS

## 2013-02-17 MED ORDER — SODIUM CHLORIDE 0.9 % IV SOLN
0.0000 ug/h | INTRAVENOUS | Status: DC
  Administered 2013-02-17 – 2013-02-19 (×2): 50 ug/h via INTRAVENOUS
  Filled 2013-02-17 (×2): qty 50

## 2013-02-17 MED ORDER — ETOMIDATE 2 MG/ML IV SOLN
10.0000 mg | Freq: Once | INTRAVENOUS | Status: AC
  Administered 2013-02-17: 10 mg via INTRAVENOUS

## 2013-02-17 MED ORDER — FENTANYL BOLUS VIA INFUSION
25.0000 ug | INTRAVENOUS | Status: DC | PRN
  Administered 2013-02-17: 50 ug via INTRAVENOUS
  Filled 2013-02-17: qty 50

## 2013-02-17 MED ORDER — PANTOPRAZOLE SODIUM 40 MG IV SOLR
40.0000 mg | INTRAVENOUS | Status: DC
  Administered 2013-02-17 – 2013-02-19 (×3): 40 mg via INTRAVENOUS
  Filled 2013-02-17 (×4): qty 40

## 2013-02-17 MED ORDER — NOREPINEPHRINE BITARTRATE 1 MG/ML IJ SOLN
2.0000 ug/min | INTRAVENOUS | Status: DC
  Administered 2013-02-17: 18 ug/min via INTRAVENOUS
  Administered 2013-02-18 – 2013-02-19 (×2): 4 ug/min via INTRAVENOUS
  Filled 2013-02-17 (×4): qty 4

## 2013-02-17 MED ORDER — ASPIRIN 81 MG PO CHEW
81.0000 mg | CHEWABLE_TABLET | Freq: Once | ORAL | Status: AC
  Administered 2013-02-17: 81 mg via ORAL
  Filled 2013-02-17 (×2): qty 1

## 2013-02-17 MED ORDER — VANCOMYCIN HCL IN DEXTROSE 750-5 MG/150ML-% IV SOLN
750.0000 mg | INTRAVENOUS | Status: DC
  Administered 2013-02-17 – 2013-02-18 (×2): 750 mg via INTRAVENOUS
  Filled 2013-02-17 (×3): qty 150

## 2013-02-17 MED ORDER — OSELTAMIVIR PHOSPHATE 6 MG/ML PO SUSR
30.0000 mg | Freq: Every day | ORAL | Status: DC
  Administered 2013-02-17 – 2013-02-18 (×2): 30 mg
  Filled 2013-02-17 (×3): qty 5

## 2013-02-17 MED ORDER — HEPARIN (PORCINE) IN NACL 100-0.45 UNIT/ML-% IJ SOLN
800.0000 [IU]/h | INTRAMUSCULAR | Status: DC
  Administered 2013-02-17: 800 [IU]/h via INTRAVENOUS
  Filled 2013-02-17 (×2): qty 250

## 2013-02-17 MED ORDER — CHLORHEXIDINE GLUCONATE 0.12 % MT SOLN
15.0000 mL | Freq: Two times a day (BID) | OROMUCOSAL | Status: DC
  Administered 2013-02-17 – 2013-02-20 (×7): 15 mL via OROMUCOSAL
  Filled 2013-02-17 (×7): qty 15

## 2013-02-17 MED ORDER — INSULIN ASPART 100 UNIT/ML ~~LOC~~ SOLN
2.0000 [IU] | SUBCUTANEOUS | Status: DC
Start: 2013-02-17 — End: 2013-02-20
  Administered 2013-02-17 (×2): 4 [IU] via SUBCUTANEOUS
  Administered 2013-02-18 (×2): 2 [IU] via SUBCUTANEOUS
  Administered 2013-02-18 – 2013-02-19 (×5): 4 [IU] via SUBCUTANEOUS
  Administered 2013-02-19 (×2): 2 [IU] via SUBCUTANEOUS
  Administered 2013-02-19 (×2): 4 [IU] via SUBCUTANEOUS
  Administered 2013-02-20: 2 [IU] via SUBCUTANEOUS
  Administered 2013-02-20: 4 [IU] via SUBCUTANEOUS
  Administered 2013-02-20: 2 [IU] via SUBCUTANEOUS

## 2013-02-17 MED ORDER — MIDAZOLAM HCL 2 MG/2ML IJ SOLN
INTRAMUSCULAR | Status: AC
  Administered 2013-02-17: 2 mg
  Filled 2013-02-17: qty 4

## 2013-02-17 MED ORDER — NOREPINEPHRINE BITARTRATE 1 MG/ML IJ SOLN
2.0000 ug/min | INTRAVENOUS | Status: DC
  Administered 2013-02-17: 2 ug/min via INTRAVENOUS
  Filled 2013-02-17: qty 4

## 2013-02-17 MED ORDER — VASOPRESSIN 20 UNIT/ML IJ SOLN
0.0300 [IU]/min | INTRAVENOUS | Status: DC
  Administered 2013-02-17 – 2013-02-19 (×2): 0.03 [IU]/min via INTRAVENOUS
  Filled 2013-02-17 (×2): qty 2.5

## 2013-02-17 MED ORDER — ALBUTEROL SULFATE HFA 108 (90 BASE) MCG/ACT IN AERS
4.0000 | INHALATION_SPRAY | RESPIRATORY_TRACT | Status: DC | PRN
  Filled 2013-02-17: qty 6.7

## 2013-02-17 MED ORDER — DEXTROSE 5 % IV SOLN
1.0000 g | INTRAVENOUS | Status: DC
  Administered 2013-02-17 – 2013-02-20 (×4): 1 g via INTRAVENOUS
  Filled 2013-02-17 (×4): qty 1

## 2013-02-17 MED ORDER — ENOXAPARIN SODIUM 30 MG/0.3ML ~~LOC~~ SOLN
30.0000 mg | Freq: Every day | SUBCUTANEOUS | Status: DC
  Filled 2013-02-17: qty 0.3

## 2013-02-17 MED ORDER — FENTANYL CITRATE 0.05 MG/ML IJ SOLN
INTRAMUSCULAR | Status: AC
  Administered 2013-02-17: 50 ug
  Filled 2013-02-17: qty 2

## 2013-02-17 MED ORDER — HYDROCORTISONE SOD SUCCINATE 100 MG IJ SOLR
50.0000 mg | Freq: Four times a day (QID) | INTRAMUSCULAR | Status: DC
  Administered 2013-02-17 – 2013-02-19 (×7): 50 mg via INTRAVENOUS
  Administered 2013-02-19: 15:00:00 via INTRAVENOUS
  Administered 2013-02-19 – 2013-02-20 (×3): 50 mg via INTRAVENOUS
  Filled 2013-02-17: qty 1
  Filled 2013-02-17: qty 2
  Filled 2013-02-17 (×4): qty 1
  Filled 2013-02-17 (×2): qty 2
  Filled 2013-02-17 (×7): qty 1

## 2013-02-17 MED ORDER — ACETAMINOPHEN 160 MG/5ML PO SOLN
650.0000 mg | Freq: Four times a day (QID) | ORAL | Status: DC | PRN
  Administered 2013-02-17: 650 mg
  Filled 2013-02-17: qty 20.3

## 2013-02-17 MED ORDER — HEPARIN BOLUS VIA INFUSION
3000.0000 [IU] | Freq: Once | INTRAVENOUS | Status: AC
  Administered 2013-02-17: 3000 [IU] via INTRAVENOUS
  Filled 2013-02-17: qty 3000

## 2013-02-17 MED ORDER — FENTANYL CITRATE 0.05 MG/ML IJ SOLN
50.0000 ug | Freq: Once | INTRAMUSCULAR | Status: AC
  Administered 2013-02-17: 50 ug via INTRAVENOUS

## 2013-02-17 MED ORDER — DEXTROSE 5 % IV SOLN
500.0000 mg | INTRAVENOUS | Status: DC
  Administered 2013-02-17 – 2013-02-19 (×3): 500 mg via INTRAVENOUS
  Filled 2013-02-17 (×4): qty 500

## 2013-02-17 MED ORDER — ROCURONIUM BROMIDE 50 MG/5ML IV SOLN
1.0000 mg/kg | Freq: Once | INTRAVENOUS | Status: AC
  Administered 2013-02-17: 66.1 mg via INTRAVENOUS

## 2013-02-17 NOTE — Progress Notes (Signed)
eLink Physician-Brief Progress Note Patient Name: Baani Bober DOB: 05-Jan-1923 MRN: [id number]  Date of Service  02/17/2013   HPI/Events of Note   Worsening hypotension.  Levophed at 20 mcg.  eICU Interventions  Vasopressin started, stress dose steroids, increased IVF and check cortisol level.      YACOUB,WESAM 02/17/2013, 7:34 PM

## 2013-02-17 NOTE — Procedures (Signed)
PCCM Bronchoscopy Procedure Note  The patient was informed of the risks (including but not limited to bleeding, infection, respiratory failure, lung injury, tooth/oral injury) and benefits of the procedure and gave consent, see chart.  Indication: Severe CAP, need lower respiratory sample  Location: [facility] ICU, Room 1234  Condition pre procedure: Critically ill, sedated on vent  Medications for procedure: Fentanyl gtt  Procedure description: The bronchoscope was introduced through the endotracheal tube and passed to the bilateral lungs to the level of the subsegmental bronchi throughout the tracheobronchial tree.  Airway exam revealed endotracheal tube too low; the bronchial epithelium was normal in appearance throughout the tracheobronchial tree; thin secretions were noted in the LUL and LLL bilaterally which were not obstructing the airway.  Procedures performed:  BAL anteriomedial basal segment left lower lobe  Specimens sent: BAL > bacterial and viral culture and extended viral PCR panel  Condition post procedure: critically ill, sedated on vent  Patient instructions: none  Complications: none  EBL: minimal  Yolonda Kida PCCM Pager: [phone] Cell: [phone] If no response, call [phone]

## 2013-02-17 NOTE — Progress Notes (Signed)
ANTIBIOTIC CONSULT NOTE - INITIAL  Pharmacy Consult for Cefepime Indication: suspected PNA  No Known Allergies  Patient Measurements: Height: 5\' 6"  (167.6 cm) Weight: 145 lb 11.6 oz (66.1 kg) IBW/kg (Calculated) : 59.3  Vital Signs: Temp: 99 F (37.2 C) (12/16 0730) Temp src: Core (Comment) (12/16 0700) BP: 105/50 mmHg (12/16 0730) Pulse Rate: 81 (12/16 0730)  Labs:  Recent Labs  02/27/2013 1950 02/15/13 0359 02/17/13 0100  WBC 13.7* 10.9* 20.1*  HGB 12.6 11.0* 12.0  PLT 379 350 338  CREATININE 1.18* 1.11* 1.45*   Estimated Creatinine Clearance: 25.1 ml/min (by C-G formula based on Cr of 1.45). No results found for this basename: VANCOTROUGH, Leodis Binet, VANCORANDOM, GENTTROUGH, GENTPEAK, GENTRANDOM, TOBRATROUGH, TOBRAPEAK, TOBRARND, AMIKACINPEAK, AMIKACINTROU, AMIKACIN,  in the last 72 hours    Assessment: 22 yoF presented 12/13 with SOB. CXR with diffuse airspace dz (2/2 pulmonary edema). Levaquin start for r/o PNA. CXR 12/14 with increased bilateral pulmonary opacities, cannot r/o infectious process and pt with incr WOB - MD added Vancomycin for suspected PNA. Overnight, pt with persistent tachypnea with hypoxemia, fever, increased leukocytosis .Marland Kitchen Levaquin discontinued and starting Cefepime. Repeat CXR with no changes.  12/13 >> Levaquin >> 12/16 12/15 >> Vanc >>  12/16 >> Cefepime >>   Tmax: 102.4 WBCs: up to 20.1K Renal: Scr up to 1.45, CG 25, N 30 PCT 12/15 = 1.41 (up from 0.15 on 12/13)  12/13 blood x 2 >> NGTD 12/13 MRSA PCR >> negative 12/14 influenza panel >> negative 12/14 urine >> NGF 12/14 legionella/strep pneumo ag >> negative  Today is D#4 Abx, D#2 Vancomycin, D#1 Cefepime. F/u cultures.   Goal of Therapy:  Vancomycin trough level 15-20 mcg/ml Eradication of infection Appropriate renal dosing of antibiotics  Plan:   Start Cefepime 1g IV q24h  Change Vancomycin to 750 mg IV q24h given decline in renal function.   Pharmacy will  f/u  Geoffry Paradise, PharmD, BCPS Pager: [phone] 9:17 AM Pharmacy #: [phone]

## 2013-02-17 NOTE — Consult Note (Signed)
PULMONARY  / CRITICAL CARE MEDICINE  Name: Alicia Yoder MRN: [id number] DOB: 07-Mar-1922    ADMISSION DATE:  02/24/2013 CONSULTATION DATE:  02/17/2013   REFERRING MD :  Linnell Fulling PRIMARY SERVICE: [facility]  CHIEF COMPLAINT:  Acute respiratory failure  BRIEF PATIENT DESCRIPTION: 79 y/o female with hypertension admitted on 12/13 with acute hypoxemic respiratory failure after a viral illness. PCCM consulted on 12/16 for worsening respiratory distress, renal failure and hypotension.  SIGNIFICANT EVENTS / STUDIES:  12/13 admission 12/16 PCCM consult 12/16 Bronchoscopy > thin secretions LUL, BAL anteriomedial basal segment LLL  LINES / TUBES: 12/16 ETT >> 12/16 L IJ CVL >>   CULTURES: 12/14 urine >> negative 12/13 blood >> negative  12/13 Viral PCR (nasal) negative >> 12/16 blood >> 12/16 BAL >> 12/16 Viral culture (bal) >> 12/16 viral panel pcr (bal) >>  ANTIBIOTICS: 12/13 levaquin >> 12/15 12/16 vanc >> 12/16 cefepime >> 12/16 Tamiflu >> 12/16 azithro >>  HISTORY OF PRESENT ILLNESS:  79 y/o female with hypertension admitted on 12/13 with acute hypoxemic respiratory failure after a viral illness. PCCM consulted on 12/16 for worsening respiratory distress, renal failure and hypotension. 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  For about a week she has had fever, body aches, cough, and increasing dyspnea.  She saw an outside physician a few days prior to admission and was told that she had a viral illness and they recommended OTC symptom management.  She developed worsening fever, chest congestion and dyspnea so she went to the [facility] ED on 12/13.  There she was found to have bilateral airspace disease and hypoxemic respiratory failure.  She was admitted to the hospitalist service and treated for possible CHF.  Initially she required 2L [place], but within 24 hours she was placed on a venturi mask and has been  maintained on 40-50% FiO2.  On 12/16 PCCM was consulted for dyspena, hypoxemia, AKI, and a rising WBC.    PAST MEDICAL HISTORY :  Past Medical History  Diagnosis Date  . HTN (hypertension)    History reviewed. No pertinent past surgical history. Prior to Admission medications   Medication Sig Start Date End Date Taking? Authorizing Provider  aspirin EC 81 MG tablet Take 81 mg by mouth daily.   Yes Historical Provider, MD  atenolol (TENORMIN) 100 MG tablet Take 100 mg by mouth daily. 11/18/12  Yes Historical Provider, MD  Multiple Vitamin (MULTIVITAMIN WITH MINERALS) TABS tablet Take 1 tablet by mouth daily.   Yes Historical Provider, MD   No Known Allergies  FAMILY HISTORY:  History reviewed. No pertinent family history. SOCIAL HISTORY:  reports that she has never smoked. She does not have any smokeless tobacco history on file. She reports that she does not drink alcohol or use illicit drugs.  REVIEW OF SYSTEMS:   Gen: + fever, + chills, weight change, + fatigue, night sweats HEENT: Denies blurred vision, double vision, hearing loss, tinnitus, sinus congestion, rhinorrhea, sore throat, neck stiffness, dysphagia PULM: per HPI CV: Denies chest pain, edema, orthopnea, paroxysmal nocturnal dyspnea, palpitations GI: Denies abdominal pain, nausea, vomiting, diarrhea, hematochezia, melena, constipation, change in bowel habits GU: Denies dysuria, hematuria, polyuria, oliguria, urethral discharge Endocrine: Denies hot or cold intolerance, polyuria, polyphagia or appetite change Derm: Denies rash, dry skin, scaling or peeling skin change Heme: Denies easy bruising, bleeding, bleeding gums Neuro: Denies headache, numbness, weakness, slurred speech, loss of memory  or consciousness   SUBJECTIVE:   VITAL SIGNS: Temp:  [98.6 F (37 C)-102.4 F (39.1 C)] 99.1 F (37.3 C) (12/16 0800) Pulse Rate:  [75-94] 81 (12/16 0730) Resp:  [28-55] 32 (12/16 0730) BP: (76-112)/(29-50) 105/50 mmHg (12/16  0730) SpO2:  [88 %-100 %] 98 % (12/16 0730) FiO2 (%):  [30 %-50 %] 40 % (12/16 0730) Weight:  [66.1 kg (145 lb 11.6 oz)] 66.1 kg (145 lb 11.6 oz) (12/16 0745) HEMODYNAMICS:   VENTILATOR SETTINGS: Vent Mode:  [-]  FiO2 (%):  [30 %-50 %] 40 % INTAKE / OUTPUT: Intake/Output     12/15 0701 - 12/16 0700 12/16 0701 - 12/17 0700   P.O. 830    I.V. (mL/kg)     IV Piggyback 350    Total Intake(mL/kg) 1180 (17.4)    Urine (mL/kg/hr) 1475 (0.9) 60 (0.3)   Stool     Total Output 1475 60   Net -295 -60          PHYSICAL EXAMINATION:  General:  Tachypnic, speaking in complete sentences Neuro:  A&Ox4, MAEW HEENT:  NCAT, PERRL, OP clear, neck supple Cardiovascular:  RRR, no murmur, positive gallop, no JVD  Lungs:  Crackles in bases L > R Abdomen:  BS+, soft, nontender Musculoskeletal:  Normal bulk and tone Skin:  No rash or breakdown Ext: warm, no cyanosis or clubbing; trace pretibial edema  LABS:  CBC  Recent Labs Lab 02/26/2013 1950 02/15/13 0359 02/17/13 0100  WBC 13.7* 10.9* 20.1*  HGB 12.6 11.0* 12.0  HCT 38.2 33.1* 35.0*  PLT 379 350 338   Coag's No results found for this basename: APTT, INR,  in the last 168 hours BMET  Recent Labs Lab 02/25/2013 1950 02/15/13 0359 02/17/13 0100  NA 137 137 132*  K 4.3 4.4 3.7  CL 103 106 98  CO2 23 23 24   BUN 16 15 21   CREATININE 1.18* 1.11* 1.45*  GLUCOSE 145* 102* 195*   Electrolytes  Recent Labs Lab 02/07/2013 1950 02/15/13 0359 02/17/13 0100  CALCIUM 8.5 8.0* 8.2*   Sepsis Markers  Recent Labs Lab 02/09/2013 1950 02/19/2013 2016 02/15/13 0359 02/16/13 0349 02/17/13 0929  LATICACIDVEN  --  2.27* 1.0  --  1.3  PROCALCITON 0.15  --   --  1.41  --    ABG  Recent Labs Lab 02/16/13 2321 02/17/13 0727  PHART 7.496* 7.479*  PCO2ART 33.2* 35.2  PO2ART 50.3* 48.7*   Liver Enzymes  Recent Labs Lab 02/27/2013 1950  AST 20  ALT 7  ALKPHOS 71  BILITOT 0.3  ALBUMIN 2.2*   Cardiac Enzymes  Recent Labs Lab  02/15/13 0359 02/17/13 0929  TROPONINI  --  <0.30  PROBNP 1168.0*  --    Glucose No results found for this basename: GLUCAP,  in the last 168 hours   EKG: NSR, no ST wave changes  12/16 CXR: bilateral airspace disease, perhaps trace pleural effusion  ASSESSMENT / PLAN:  PULMONARY A:  Acute hypoxemic respiratory failure due to pneumonia, ARDS Pneumonia P:   -needs mechanical ventilation, wants to wait until after she sees family  CARDIOVASCULAR A:  Mild hypotension likely due to diuresis, consider sepsis P:  -tele -place CVL -measure CVP -agree with echo  RENAL A:   AKI > presumably due to diuresis P:   -send urine lytes and urea given lasix this morning -hold further IVF for now -repeat BMET later today  GASTROINTESTINAL A:   No acute issues P:   -Pantoprazole for  stress ulcer prophylaxis -OG tube  HEMATOLOGIC A:  No acute issues P:  -monitor cbc  INFECTIOUS A:   Severe Community acquired pneumonia > ddx includes viral pneumonia (strong suspicion given preceding viral illness, works in daycare, and widespread activity in [place]) vs bacterial  UTI > culture negative  P:   -add azithro for atypical coverage -repeat cultures today -bronch for bal/lower respiratory viral culture -empiric tamiflu  ENDOCRINE A:   No acute issues P:   -monitor glucose  NEUROLOGIC A:   No acute issues P:   -sedation with fentanyl gtt titrated to RASS -1  TODAY'S SUMMARY: Despite her age, this is a very functional 79 y/o with severe CAP who has worsened in the last 72 hours.  I am worried that she as a viral pneumonia and will treat for flu in addition to severe CAP organisms.  Code status: lengthy conversation with patient and family, short term aggressive care, intubation OK  I have personally obtained a history, examined the patient, evaluated laboratory and imaging results, formulated the assessment and plan and placed orders. CRITICAL CARE: The patient is  critically ill with multiple organ systems failure and requires high complexity decision making for assessment and support, frequent evaluation and titration of therapies, application of advanced monitoring technologies and extensive interpretation of multiple databases. Critical Care Time devoted to patient care services described in this note is 60 minutes.    Yolonda Kida PCCM Pager: [phone] Cell: [phone] If no response, call [phone]   02/17/2013, 10:36 AM

## 2013-02-17 NOTE — Progress Notes (Signed)
CRITICAL VALUE ALERT  Critical value received:  Troponin 1.76  Date of notification:  02-17-2013  Time of notification:  1629  Critical value read back:  yes  Nurse who received alert:  Roney Jaffe, RN  MD notified (1st page):  Dr. Molli Knock  Time of first page:  1640  MD notified (2nd page  Time of second page:  Responding MD:  Dr. Molli Knock  Time MD responded:  immediately

## 2013-02-17 NOTE — Progress Notes (Signed)
Event: Notified by RN on several occasions regarding pt's persistent tachypnea w/ RR's from 38-48. 02 sats 88-93% on VM at 30%. ABG obtained and revealed pH-7.49, pC02-33.2, p02-50.3 w/ bicarb of 24.9. 02 delivery increased VM at 50%. Lasix 40 mg given just before midnite and pt has put out approx 250 cc. Though sats improved pt remains tachypneac. RN had labs drawn early. Requested Xopenx neb be given and stat PCXR ordered. NP to bedside. Subjective: Pt is w/o specific c/o's. Objective: Ms Touchton is an 79 y/o female admitted 02/15/2013 after presenting to [facility]-ED w/ c/o increasing SOB over 2 days, cough and low grade temp. In ED pt noted to be tachycardic, tachypneac w/ temp of 101. Pt placed on Levaquin and admitted to SDU. Since admission pt has remained tachypneac despite reporting feeling some better. Vanc was added on the morning of 02/16/13 and pt kept in SDU d/t persistent tachypnea. At bedside pt noted resting w/ eyes closed. She awakens easily and is mildly tachypneac w/ some accessory muscle use.  At this time w/ RR's of 24-32 and 02 sats of 100% on 40% VM. No overt respiratory distress. BBS diminished w/ faint crackles at the bases L>R. Temp 98.8 from 102 just before midnite, P-76 and current BP soft at 91/44. WBC though initially improved from 13.7 to 10.9, now 20.1. U/a from am of 02/15/13 positive, culture pending. Blood cx's from 02/15/2013 reveal no growth to date. Strep pneumococcus antigen and legionella urinary antigen both negative. Stat PCXR today is w/o acute changes from 02/15/13.  Assessment/Plan: 1. Persistent Tachypnea w/ hypoxemia in setting of pt w/ Sepsis, CAP and acute CHF exacerbation. Continue Lasix (as BP tolerates), Vanc and Levaquin. Repeat ABG to assess resolving hypoxemia. 2. Hypotension: Will give small NS bolus of 250 cc. Consider holding am atenolol and Lasix if no improvement.  3. UTI: Continue Levaquin, await culture. Will defer further changes in pt's plan of care to  rounding MD. Will continue to monitor closely in SDU.  Leanne Chang, NP-C [facility] Pager [phone]

## 2013-02-17 NOTE — Progress Notes (Signed)
INITIAL NUTRITION ASSESSMENT  Pt meets criteria for severe MALNUTRITION in the context of chronic illness as evidenced by <75% estimated energy intake with 6.5% weight loss in the past month per family's report.  DOCUMENTATION CODES Per approved criteria  -Severe malnutrition in the context of chronic illness   INTERVENTION: - If pt unable to be extubated in the next 24-48 hours, recommend initiation of enteral nutrition, TF of Oxepa start at 42ml/hr increase by 10ml every 4 hours to goal of 48ml/hr. Goal rate of TF will provide 1440 calories, 60g protein, and free water and meet 94% estimated calorie needs, 91% estimated protein needs - Will continue to monitor   NUTRITION DIAGNOSIS: Inadequate oral intake related to inability to eat as evidenced by NPO/mechanical veniltation.   Goal: Initiation of enteral nutrition with goal to meet >90% of estimated nutritional needs  Monitor:  Weights, labs, vent status, TF  Reason for Assessment: Ventilated pt  79 y.o. female  Admitting Dx: Sepsis  ASSESSMENT: Admitted with shortness of breath x 2 days. Per H&P, pt has not been feeling well since Thanksgiving when she developed a running nose, nonproductive cough, generalized weakness but no fever or difficulty breathing. Her symptoms apparently got worse 3-4 days ago where she was having chest congestion, unable to bring her sputum out and difficulty breathing. She was evaluated at her PCPs office with EKG and blood tests. She was told to have a vital illness and recommended symptomatic treatment with Mucinex and cough medications which she started 2 days ago. Her appetite was poor. Pt found to have community acquired pneumonia. Intubated this morning for respiratory insufficiency and ARDS.   Met with family in room who encouraged me to talk with pt's niece in waiting room to obtain pt's diet history. She reports pt has had a poor appetite since Thanksgiving with pt only eating around 1 good  meal/day. Tried nutritional supplements but did not like them. Thinks she has been losing 10-15 pounds recently. States pt enjoys sweets.    Patient is currently intubated on ventilator support.  MV: 6.1 L/min Temp (24hrs), Avg:100.1 F (37.8 C), Min:98.6 F (37 C), Max:102.4 F (39.1 C)  Propofol: off   Height: Ht Readings from Last 1 Encounters:  02/17/13 5\' 3"  (1.6 m)    Weight: Wt Readings from Last 1 Encounters:  02/17/13 145 lb 11.6 oz (66.1 kg)    Ideal Body Weight: 115 lb   % Ideal Body Weight: 126%  Wt Readings from Last 10 Encounters:  02/17/13 145 lb 11.6 oz (66.1 kg)    Usual Body Weight: 155-160 lb per family  % Usual Body Weight: 91-93%  BMI:  Body mass index is 25.82 kg/(m^2).  Estimated Nutritional Needs: Kcal: 1530 Protein: 66-80g Fluid: > 1.5L/day  Skin: Non-pitting RLE, LLE edema  Diet Order: Cardiac  EDUCATION NEEDS: -No education needs identified at this time   Intake/Output Summary (Last 24 hours) at 02/17/13 1226 Last data filed at 02/17/13 1042  Gross per 24 hour  Intake    900 ml  Output   1525 ml  Net   -625 ml    Last BM: 12/15  Labs:   Recent Labs Lab 02/07/2013 1950 02/15/13 0359 02/17/13 0100  NA 137 137 132*  K 4.3 4.4 3.7  CL 103 106 98  CO2 23 23 24   BUN 16 15 21   CREATININE 1.18* 1.11* 1.45*  CALCIUM 8.5 8.0* 8.2*  GLUCOSE 145* 102* 195*    CBG (last 3)  No results found for this basename: GLUCAP,  in the last 72 hours  Scheduled Meds: . antiseptic oral rinse  15 mL Mouth Rinse BID  . aspirin EC  81 mg Oral Daily  . atenolol  100 mg Oral Daily  . azithromycin  500 mg Intravenous Q24H  . ceFEPime (MAXIPIME) IV  1 g Intravenous Q24H  . chlorhexidine  15 mL Mouth Rinse BID  . enoxaparin (LOVENOX) injection  30 mg Subcutaneous QHS  . guaiFENesin  600 mg Oral BID  . multivitamin with minerals  1 tablet Oral Daily  . oseltamivir  30 mg Per Tube Daily  . pantoprazole (PROTONIX) IV  40 mg Intravenous  Q24H  . sodium chloride  3 mL Intravenous Q12H  . vancomycin  750 mg Intravenous Q24H    Continuous Infusions: . fentaNYL infusion INTRAVENOUS      Past Medical History  Diagnosis Date  . HTN (hypertension)     History reviewed. No pertinent past surgical history.  Levon Hedger MS, RD, LDN [phone] Pager [phone] After Hours Pager

## 2013-02-17 NOTE — Progress Notes (Signed)
[facility] PROGRESS NOTE  Kathalina Ostermann ZOX:[id number] DOB: 1923/01/07 DOA: 02/27/2013 PCP: Lolita Patella, MD  Brief narrative: Patient is an 79 year old African American female who presented to the emergency department complaining of difficulty breathing associated with cough and fever. Reportedly she has not had a history of any cardiac disease. Was admitted to step down initially on Levaquin and further evaluation would reveal an elevated BNP as well as chest x-ray suspicious for pulmonary edema. As a result patient has been diuresed with Lasix.  Comparing weights has lost approximately 4 pounds. Lasix will be discontinued given reports of soft blood pressures overnight and antibiotics, given elevation in white blood cell count and persistent fevers, will be broadened. Critical care team has been consulted.  Assessment/Plan: Principal Problem:    Sepsis - Continue to monitor closely in Stepdown - Patient continues to be tachypneic and has had periods of hypotension which I think are related to aggressive diuresis versus worsening pneumonia. At this point will discontinue Lasix. - We'll plan on changing antibiotic regimen to cefepime and vancomycin - Obtain lactic acid - Consult critical care service - Blood cultures to date negative - It is my impression that patient's shortness of breath is most likely multifactorial initially my thoughts were that patient's acute CHF was contributing to her symptoms primarily. But it is my impression that patient's respiratory condition is slow to improve due to infectious etiology as opposed to decompensated CHF. Hence the discontinuation of Lasix and change in antibiotic regimen.  Active Problems:    Pneumonia, community acquired  - Influenza pane: negative - Strep pneumococcus urinary antigen negative, legionella urinary antigen negative - Patient reports feeling better, but has been tachypneic - Patient reports being mouth breather  and currently saturating well on Ventimask, appreciate critical care consult  Acute CHF exacerbation - Chest x-ray reported possibility of diffuse pulmonary edema and BNP elevated > 1000. - strict i/o's, daily weights, fluid restriction - Will place order to discontinue Lasix 02/17/2013 given soft blood pressures overnight. - Obtain echocardiogram to further characterize CHF - Given no prior history of CHF we'll also check troponin levels    Sinus tachycardia - Resolving, EKG reviewed and showed sinus rhythm with intermittent PVCs.  Code Status: full Family Communication: Discussed with patient and nieces at bedside Disposition Plan: With improvement in tachycardia and tachypnea transitioned to floor most likely within the next one to 2 days   Consultants: Critical care  Procedures:  None  Antibiotics:  Vancomycin  Cefepime started 02/17/2013  HPI/Subjective: Patient feels better today. Reports indicate the patient had soft blood pressures overnight requiring fluid bolus. The patient currently has Ventimask and states that she feels better. She reports to me that she most. Her mouth but is trying to breathe out of her nose. No new complaints reported by patient overnight  Objective: Filed Vitals:   02/17/13 0800  BP:   Pulse:   Temp: 99.1 F (37.3 C)  Resp:     Intake/Output Summary (Last 24 hours) at 02/17/13 0927 Last data filed at 02/17/13 8119  Gross per 24 hour  Intake   1000 ml  Output   1135 ml  Net   -135 ml   Filed Weights   02/15/13 0400 02/16/13 0400 02/17/13 0745  Weight: 68.9 kg (151 lb 14.4 oz) 67.9 kg (149 lb 11.1 oz) 66.1 kg (145 lb 11.6 oz)    Exam:   General:  Patient in no acute distress, alert and awake  Cardiovascular: Regular rate and rhythm,  no murmurs  Respiratory: No wheezes, increased respiratory rate, rhales at bases BL  Abdomen: Soft, nondistended  Musculoskeletal: No cyanosis, no clubbing  Data Reviewed: Basic Metabolic  Panel:  Recent Labs Lab 03/02/2013 1950 02/15/13 0359 02/17/13 0100  NA 137 137 132*  K 4.3 4.4 3.7  CL 103 106 98  CO2 23 23 24   GLUCOSE 145* 102* 195*  BUN 16 15 21   CREATININE 1.18* 1.11* 1.45*  CALCIUM 8.5 8.0* 8.2*   Liver Function Tests:  Recent Labs Lab 02/17/2013 1950  AST 20  ALT 7  ALKPHOS 71  BILITOT 0.3  PROT 6.5  ALBUMIN 2.2*   No results found for this basename: LIPASE, AMYLASE,  in the last 168 hours No results found for this basename: AMMONIA,  in the last 168 hours CBC:  Recent Labs Lab 02/18/2013 1950 02/15/13 0359 02/17/13 0100  WBC 13.7* 10.9* 20.1*  NEUTROABS 8.3*  --   --   HGB 12.6 11.0* 12.0  HCT 38.2 33.1* 35.0*  MCV 97.2 97.4 94.6  PLT 379 350 338   Cardiac Enzymes: No results found for this basename: CKTOTAL, CKMB, CKMBINDEX, TROPONINI,  in the last 168 hours BNP (last 3 results)  Recent Labs  02/15/13 0359  PROBNP 1168.0*   CBG: No results found for this basename: GLUCAP,  in the last 168 hours  Recent Results (from the past 240 hour(s))  CULTURE, BLOOD (ROUTINE X 2)     Status: None   Collection Time    02/09/2013  8:14 PM      Result Value Range Status   Specimen Description BLOOD RIGHT ANTECUBITAL   Final   Special Requests BOTTLES DRAWN AEROBIC AND ANAEROBIC 5CC   Final   Culture  Setup Time     Final   Value: 02/15/2013 02:28     Performed at [facility]   Culture     Final   Value:        BLOOD CULTURE RECEIVED NO GROWTH TO DATE CULTURE WILL BE HELD FOR 5 DAYS BEFORE ISSUING A FINAL NEGATIVE REPORT     Performed at [facility]   Report Status PENDING   Incomplete  CULTURE, BLOOD (ROUTINE X 2)     Status: None   Collection Time    02/03/2013  8:47 PM      Result Value Range Status   Specimen Description BLOOD LEFT HAND   Final   Special Requests BOTTLES DRAWN AEROBIC AND ANAEROBIC 1.5CC   Final   Culture  Setup Time     Final   Value: 02/15/2013 02:28     Performed at [facility]   Culture      Final   Value:        BLOOD CULTURE RECEIVED NO GROWTH TO DATE CULTURE WILL BE HELD FOR 5 DAYS BEFORE ISSUING A FINAL NEGATIVE REPORT     Performed at [facility]   Report Status PENDING   Incomplete  MRSA PCR SCREENING     Status: None   Collection Time    03/01/2013 11:00 PM      Result Value Range Status   MRSA by PCR NEGATIVE  NEGATIVE Final   Comment:            The GeneXpert MRSA Assay (FDA     approved for NASAL specimens     only), is one component of a     comprehensive MRSA colonization     surveillance program. It  is not     intended to diagnose MRSA     infection nor to guide or     monitor treatment for     MRSA infections.  URINE CULTURE     Status: None   Collection Time    02/15/13  4:49 AM      Result Value Range Status   Specimen Description URINE, RANDOM   Final   Special Requests NONE   Final   Culture  Setup Time     Final   Value: 02/15/2013 15:56     Performed at [facility] Count     Final   Value: NO GROWTH     Performed at [facility]   Culture     Final   Value: NO GROWTH     Performed at [facility]   Report Status 02/16/2013 FINAL   Final     Studies: Dg Chest Port 1 View  02/17/2013   CLINICAL DATA:  Shortness of breath  EXAM: PORTABLE CHEST - 1 VIEW  COMPARISON:  02/15/2013  FINDINGS: No appreciable change in diffuse interstitial and airspace disease with small pleural effusions. No interval lobar collapse, significant fluid increase, or pneumothorax. Stable cardiac enlargement. Upper thoracic levoscoliosis.  IMPRESSION: Unchanged diffuse aeration disturbance, likely a combination of pneumonia and edema. Small pleural effusions.   Electronically Signed   By: Tiburcio Pea M.D.   On: 02/17/2013 04:49   Dg Chest Port 1 View  02/15/2013   CLINICAL DATA:  Shortness of breath history of hypertension  EXAM: PORTABLE CHEST - 1 VIEW  COMPARISON:  02/03/2013  FINDINGS: Diffuse bilateral pulmonary  opacities appreciated mid radius confluence in the left lung base. There is blunting of the right and left costophrenic angles. When compared to the previous study there has been diffuse increase conspicuity of the pulmonary opacities. There is a more could confluent consolidative component in the left hilar region. S-shaped scoliosis identified within the thoracolumbar spine. Degenerative changes appreciated within the right lobe shoulders. Cardiac silhouette is partially obscured though enlarged.  IMPRESSION: Diffuse bilateral pulmonary opacities increased in severity when compared to previous study. These findings likely represent increased pulmonary edema. An accompanying infectious or inflammatory infiltrate cannot be excluded clinically appropriate. There are also findings likely reflecting small bilateral effusions left greater than right. Continued surveillance evaluation recommended.   Electronically Signed   By: Salome Holmes M.D.   On: 02/15/2013 17:16    Scheduled Meds: . antiseptic oral rinse  15 mL Mouth Rinse BID  . aspirin EC  81 mg Oral Daily  . atenolol  100 mg Oral Daily  . ceFEPime (MAXIPIME) IV  1 g Intravenous Q24H  . enoxaparin (LOVENOX) injection  30 mg Subcutaneous QHS  . guaiFENesin  600 mg Oral BID  . multivitamin with minerals  1 tablet Oral Daily  . sodium chloride  3 mL Intravenous Q12H  . vancomycin  750 mg Intravenous Q24H   Continuous Infusions:    Time spent: > 35 minutes of critical care time.    Penny Pia  [facility] Pager [phone] If 7PM-7AM, please contact night-coverage at [email or web address], password [facility] 02/17/2013, 9:27 AM  LOS: 3 days

## 2013-02-17 NOTE — Procedures (Signed)
Intubation Procedure Note Alicia Yoder [id number] 11-24-1922  Procedure: Intubation Indications: Respiratory insufficiency  Procedure Details Consent: Risks of procedure as well as the alternatives and risks of each were explained to the (patient/caregiver).  Consent for procedure obtained. Time Out: Verified patient identification, verified procedure, site/side was marked, verified correct patient position, special equipment/implants available, medications/allergies/relevent history reviewed, required imaging and test results available.  Performed  Drugs Versed 2mg , Etomidate 20mg , Fentanyl , Rocuronium 70mg  DL x 1 with MAC 3 blade Grade 1 view 8-0 tube passed through cords under direct visualization Placement confirmed with bilateral breath sounds, positive EtCO2 change and smoke in tube   Evaluation Hemodynamic Status: BP stable throughout; O2 sats: stable throughout Patient's Current Condition: stable Complications: No apparent complications Patient did tolerate procedure well. Chest X-ray ordered to verify placement.  CXR: pending.   Alicia Yoder 02/17/2013

## 2013-02-17 NOTE — Progress Notes (Signed)
eLink Physician-Brief Progress Note Patient Name: Alicia Yoder DOB: 06/06/1922 MRN: [id number]  Date of Service  02/17/2013   HPI/Events of Note   Called by bedside RN, troponin mildly positive.  eICU Interventions  Likely demand ischemia.  No EKG changes.  Troponin x3 ordered.  ASA and heparin ordered.  Cards consult called.      YACOUB,WESAM 02/17/2013, 4:43 PM

## 2013-02-17 NOTE — Progress Notes (Signed)
Visited with [name], in transport.  Family requested prayer. Prayed. Offered support and explained services. Will continue to follow.

## 2013-02-17 NOTE — Progress Notes (Signed)
eLink Physician-Brief Progress Note Patient Name: Alicia Yoder DOB: 1922/05/26 MRN: [id number]  Date of Service  02/17/2013   HPI/Events of Note   Hypotension.  eICU Interventions  Levophed ordered for hypotension.  CVP is 6.  250 ml NS given.      Rashan Patient 02/17/2013, 3:43 PM

## 2013-02-17 NOTE — Progress Notes (Signed)
ANTICOAGULATION CONSULT NOTE - Initial Consult  Pharmacy Consult for Heparin Indication: chest pain/ACS  No Known Allergies  Patient Measurements: Height: 5\' 3"  (160 cm) Weight: 145 lb 11.6 oz (66.1 kg) IBW/kg (Calculated) : 52.4 Heparin Dosing Weight: 66 kg  Vital Signs: Temp: 99.3 F (37.4 C) (12/16 1630) Temp src: Core (Comment) (12/16 1300) BP: 86/45 mmHg (12/16 1630) Pulse Rate: 86 (12/16 1630)  Labs:  Recent Labs  02/11/2013 1950 02/15/13 0359 02/17/13 0100 02/17/13 0929 02/17/13 1510  HGB 12.6 11.0* 12.0  --   --   HCT 38.2 33.1* 35.0*  --   --   PLT 379 350 338  --   --   CREATININE 1.18* 1.11* 1.45*  --  1.36*  TROPONINI  --   --   --  <0.30 1.76*    Estimated Creatinine Clearance: 26.1 ml/min (by C-G formula based on Cr of 1.36).   Medical History: Past Medical History  Diagnosis Date  . HTN (hypertension)     Medications:  Scheduled:  . antiseptic oral rinse  15 mL Mouth Rinse BID  . aspirin  81 mg Oral Once  . atenolol  100 mg Oral Daily  . azithromycin  500 mg Intravenous Q24H  . ceFEPime (MAXIPIME) IV  1 g Intravenous Q24H  . chlorhexidine  15 mL Mouth Rinse BID  . guaiFENesin  600 mg Oral BID  . heparin  3,000 Units Intravenous Once  . multivitamin with minerals  1 tablet Oral Daily  . oseltamivir  30 mg Per Tube Daily  . pantoprazole (PROTONIX) IV  40 mg Intravenous Q24H  . sodium chloride  250 mL Intravenous Once  . sodium chloride  3 mL Intravenous Q12H  . vancomycin  750 mg Intravenous Q24H   Infusions:  . fentaNYL infusion INTRAVENOUS 100 mcg/hr (02/17/13 1500)  . heparin    . norepinephrine (LEVOPHED) Adult infusion 2 mcg/min (02/17/13 1624)    Assessment:  Patient known to pharmacy from vancomycin/cefepime dosing for suspected pneumonia  Patient with elevated troponin (1.76) and concern noted for demand ischemia.  Cardiology consult pending  IV heparin and ASA started   D/C'ed previous order for Lovenox for VTE  prophylaxis (last dose charted on 12/15 @ 21:51)  Goal of Therapy:  Heparin level 0.3-0.7 units/ml Monitor platelets by anticoagulation protocol: Yes   Plan:   Heparin 3000 units IV x 1 followed by heparin drip @ 800 units/hr  Check heparin level 8hr after heparin started  Check daily heparin level & CBC while on heparin  Dashae Wilcher, Joselyn Glassman, PharmD 02/17/2013,4:53 PM

## 2013-02-17 NOTE — Procedures (Signed)
Central Venous Catheter Insertion Procedure Note Alicia Yoder [id number] November 11, 1922  Procedure: Insertion of Central Venous Catheter Indications: Assessment of intravascular volume and Drug and/or fluid administration  Procedure Details Consent: Risks of procedure as well as the alternatives and risks of each were explained to the (patient/caregiver).  Consent for procedure obtained. Time Out: Verified patient identification, verified procedure, site/side was marked, verified correct patient position, special equipment/implants available, medications/allergies/relevent history reviewed, required imaging and test results available.  Performed  Maximum sterile technique was used including antiseptics, cap, gloves, gown, hand hygiene, mask and sheet. Skin prep: Chlorhexidine; local anesthetic administered A antimicrobial bonded/coated triple lumen catheter was placed in the left internal jugular vein using the Seldinger technique.  Ultrasound was used to verify the patency of the vein and for real time needle guidance.  Evaluation Blood flow good Complications: No apparent complications Patient did tolerate procedure well. Chest X-ray ordered to verify placement.  CXR: pending.  MCQUAID, DOUGLAS 02/17/2013, 12:07 PM

## 2013-02-17 NOTE — Progress Notes (Signed)
Echo Lab  2D Echocardiogram completed.  Henchy Mccauley L Jamesen Stahnke, RDCS 02/17/2013 10:57 AM

## 2013-02-18 ENCOUNTER — Inpatient Hospital Stay (HOSPITAL_COMMUNITY): Payer: Medicare Other

## 2013-02-18 DIAGNOSIS — E43 Unspecified severe protein-calorie malnutrition: Secondary | ICD-10-CM | POA: Insufficient documentation

## 2013-02-18 DIAGNOSIS — I214 Non-ST elevation (NSTEMI) myocardial infarction: Secondary | ICD-10-CM

## 2013-02-18 LAB — CBC WITH DIFFERENTIAL/PLATELET
Basophils Absolute: 0 10*3/uL (ref 0.0–0.1)
Basophils Relative: 0 % (ref 0–1)
HCT: 34.1 % — ABNORMAL LOW (ref 36.0–46.0)
Hemoglobin: 11.3 g/dL — ABNORMAL LOW (ref 12.0–15.0)
Lymphocytes Relative: 6 % — ABNORMAL LOW (ref 12–46)
Lymphs Abs: 1.5 10*3/uL (ref 0.7–4.0)
MCHC: 33.1 g/dL (ref 30.0–36.0)
MCV: 95.5 fL (ref 78.0–100.0)
Monocytes Absolute: 1.7 10*3/uL — ABNORMAL HIGH (ref 0.1–1.0)
Monocytes Relative: 8 % (ref 3–12)
Neutro Abs: 19.9 10*3/uL — ABNORMAL HIGH (ref 1.7–7.7)
Neutrophils Relative %: 86 % — ABNORMAL HIGH (ref 43–77)
RBC: 3.57 MIL/uL — ABNORMAL LOW (ref 3.87–5.11)
RDW: 13.9 % (ref 11.5–15.5)
WBC: 23.2 10*3/uL — ABNORMAL HIGH (ref 4.0–10.5)

## 2013-02-18 LAB — TROPONIN I: Troponin I: 0.68 ng/mL (ref <0.30)

## 2013-02-18 LAB — BASIC METABOLIC PANEL
BUN: 26 mg/dL — ABNORMAL HIGH (ref 6–23)
CO2: 24 mEq/L (ref 19–32)
Chloride: 100 mEq/L (ref 96–112)
Creatinine, Ser: 1.26 mg/dL — ABNORMAL HIGH (ref 0.50–1.10)
GFR calc Af Amer: 43 mL/min — ABNORMAL LOW (ref >90)
Potassium: 4 mEq/L (ref 3.5–5.1)

## 2013-02-18 LAB — BLOOD GAS, ARTERIAL
Acid-base deficit: 2 mmol/L (ref 0.0–2.0)
Drawn by: 11249
FIO2: 0.4 %
MECHVT: 420 mL
Patient temperature: 96.4
RATE: 20 resp/min
TCO2: 20.6 mmol/L (ref 0–100)
pH, Arterial: 7.389 (ref 7.350–7.450)

## 2013-02-18 LAB — GLUCOSE, CAPILLARY: Glucose-Capillary: 185 mg/dL — ABNORMAL HIGH (ref 70–99)

## 2013-02-18 LAB — PROCALCITONIN: Procalcitonin: 3.08 ng/mL

## 2013-02-18 LAB — RESPIRATORY VIRUS PANEL
Adenovirus: NOT DETECTED
Influenza A H3: NOT DETECTED
Influenza B: NOT DETECTED
Metapneumovirus: NOT DETECTED
Parainfluenza 1: NOT DETECTED
Parainfluenza 2: NOT DETECTED
Parainfluenza 3: NOT DETECTED
Rhinovirus: DETECTED — AB

## 2013-02-18 LAB — CORTISOL: Cortisol, Plasma: 23.5 ug/dL

## 2013-02-18 LAB — HEPARIN LEVEL (UNFRACTIONATED): Heparin Unfractionated: 0.35 IU/mL (ref 0.30–0.70)

## 2013-02-18 MED ORDER — OXEPA PO LIQD
1000.0000 mL | ORAL | Status: DC
  Administered 2013-02-18 – 2013-02-19 (×2): 1000 mL
  Filled 2013-02-18 (×3): qty 1000

## 2013-02-18 MED ORDER — HEPARIN (PORCINE) IN NACL 100-0.45 UNIT/ML-% IJ SOLN
1050.0000 [IU]/h | INTRAMUSCULAR | Status: DC
  Filled 2013-02-18: qty 250

## 2013-02-18 MED ORDER — VITAL AF 1.2 CAL PO LIQD
1000.0000 mL | ORAL | Status: DC
  Filled 2013-02-18: qty 1000

## 2013-02-18 MED ORDER — HEPARIN (PORCINE) IN NACL 100-0.45 UNIT/ML-% IJ SOLN
950.0000 [IU]/h | INTRAMUSCULAR | Status: DC
  Administered 2013-02-18: 950 [IU]/h via INTRAVENOUS
  Filled 2013-02-18 (×2): qty 250

## 2013-02-18 MED ORDER — PRO-STAT SUGAR FREE PO LIQD
30.0000 mL | Freq: Every day | ORAL | Status: DC
  Administered 2013-02-18 – 2013-02-19 (×2): 30 mL
  Filled 2013-02-18 (×3): qty 30

## 2013-02-18 MED ORDER — ASPIRIN 81 MG PO CHEW
81.0000 mg | CHEWABLE_TABLET | Freq: Every day | ORAL | Status: DC
  Administered 2013-02-18 – 2013-02-26 (×9): 81 mg via ORAL
  Filled 2013-02-18 (×11): qty 1

## 2013-02-18 NOTE — Progress Notes (Signed)
NUTRITION FOLLOW UP/CONSULT FOR TF MANAGEMENT  Intervention:   - Initiate TF via OGT of Oxepa start at 32ml/hr increase by 10ml every 4 hours to goal of 72ml/hr with Prostat 30ml daily. Goal rate of TF plus Prostat 30ml daily will provide 1540 calories, 75g protein, and free water and meet 100% estimated calorie needs and protein needs. If IVF d/c, recommend water flushes 6 times/day.  - Initiate adult enteral protocol - Will continue to monitor   Nutrition Dx:   Inadequate oral intake related to inability to eat as evidenced by NPO/mechanical ventilation - ongoing  Goal:   Initiation of enteral nutrition with goal to meet >90% of estimated nutritional needs - initiation met, will advance per orders to goal rate  Monitor:   Weights, labs, vent status, TF tolerance/advancement  Assessment:   Admitted with shortness of breath x 2 days. Per H&P, pt has not been feeling well since Thanksgiving when she developed a running nose, nonproductive cough, generalized weakness but no fever or difficulty breathing. Her symptoms apparently got worse 3-4 days ago where she was having chest congestion, unable to bring her sputum out and difficulty breathing. She was evaluated at her PCPs office with EKG and blood tests. She was told to have a vital illness and recommended symptomatic treatment with Mucinex and cough medications which she started 2 days ago. Her appetite was poor. Pt found to have community acquired pneumonia. Intubated this morning for respiratory insufficiency and ARDS.   12/16 - Met with family in room who encouraged me to talk with pt's niece in waiting room to obtain pt's diet history. She reports pt has had a poor appetite since Thanksgiving with pt only eating around 1 good meal/day. Tried nutritional supplements but did not like them. Thinks she has been losing 10-15 pounds recently. States pt enjoys sweets.   12/17 - Noted pt with troponin mildly positive yesterday and pt  with worsening hypotension and shock overnight per MD notes. Received consult for TF management.   Patient is currently intubated on ventilator support.  MV: 9.2 L/min Temp (24hrs), Avg:98.6 F (37 C), Min:96.4 F (35.8 C), Max:101.3 F (38.5 C)  Propofol: off   Height: Ht Readings from Last 1 Encounters:  02/17/13 5\' 3"  (1.6 m)    Weight Status:   Wt Readings from Last 1 Encounters:  02/18/13 149 lb 4 oz (67.7 kg)  Admit wt:        149 lb 7.6 oz (67.8 kg)  Net I/Os: +1L  Re-estimated needs:  Kcal: 1543 Protein: 66-80g Fluid: > 1.7L/day  Skin: +1 RLE, LLE edema  Diet Order:  NPO   Intake/Output Summary (Last 24 hours) at 02/18/13 0942 Last data filed at 02/18/13 0700  Gross per 24 hour  Intake 3295.41 ml  Output   1270 ml  Net 2025.41 ml    Last BM: 12/15   Labs:   Recent Labs Lab 02/17/13 0100 02/17/13 1510 02/18/13 0422  NA 132* 133* 133*  K 3.7 3.8 4.0  CL 98 98 100  CO2 24 25 24   BUN 21 23 26*  CREATININE 1.45* 1.36* 1.26*  CALCIUM 8.2* 8.3* 8.2*  GLUCOSE 195* 245* 150*    CBG (last 3)   Recent Labs  02/17/13 2313 02/18/13 0355 02/18/13 0745  GLUCAP 192* 122* 136*    Scheduled Meds: . antiseptic oral rinse  15 mL Mouth Rinse BID  . aspirin  81 mg Oral Daily  . azithromycin  500 mg Intravenous  Q24H  . ceFEPime (MAXIPIME) IV  1 g Intravenous Q24H  . chlorhexidine  15 mL Mouth Rinse BID  . feeding supplement (OXEPA)  1,000 mL Per Tube Q24H  . guaiFENesin  600 mg Oral BID  . hydrocortisone sodium succinate  50 mg Intravenous Q6H  . insulin aspart  2-6 Units Subcutaneous Q4H  . multivitamin with minerals  1 tablet Oral Daily  . oseltamivir  30 mg Per Tube Daily  . pantoprazole (PROTONIX) IV  40 mg Intravenous Q24H  . sodium chloride  3 mL Intravenous Q12H  . vancomycin  750 mg Intravenous Q24H    Continuous Infusions: . sodium chloride 1,000 mL (02/17/13 2010)  . fentaNYL infusion INTRAVENOUS 75 mcg/hr (02/17/13 1900)  .  heparin 950 Units/hr (02/18/13 0330)  . norepinephrine (LEVOPHED) Adult infusion 4 mcg/min (02/18/13 0447)  . vasopressin (PITRESSIN) infusion - *FOR SHOCK* 0.03 Units/min (02/17/13 2009)    Levon Hedger MS, RD, LDN [phone] Pager [phone] After Hours Pager

## 2013-02-18 NOTE — Progress Notes (Signed)
PULMONARY  / CRITICAL CARE MEDICINE  Name: Alicia Yoder MRN: [id number] DOB: May 23, 1922    ADMISSION DATE:  02/09/2013 CONSULTATION DATE:  02/17/2013   REFERRING MD :  Linnell Fulling PRIMARY SERVICE: TRH  CHIEF COMPLAINT:  Acute respiratory failure  BRIEF PATIENT DESCRIPTION: 79 y/o female with hypertension admitted on 12/13 with acute hypoxemic respiratory failure after a viral illness. PCCM consulted on 12/16 for worsening respiratory distress, renal failure and hypotension.  SIGNIFICANT EVENTS / STUDIES:  12/13 admission 12/16 PCCM consult 12/16 Bronchoscopy > thin secretions LUL, BAL anteriomedial basal segment LLL  LINES / TUBES: 12/16 ETT >> 12/16 L IJ CVL >>   CULTURES: 12/14 urine >> negative 12/13 blood >> negative  12/13 Viral PCR (nasal) negative >> 12/16 blood >> 12/16 BAL >> 12/16 Viral culture (bal) >> 12/16 viral panel pcr (bal) >>  ANTIBIOTICS: 12/13 levaquin >> 12/15 12/16 vanc >> 12/16 cefepime >> 12/16 Tamiflu >> 12/16 azithro >>    SUBJECTIVE: Worsening shock overnight, troponin positive  VITAL SIGNS: Temp:  [96.4 F (35.8 C)-101.3 F (38.5 C)] 96.8 F (36 C) (12/17 0700) Pulse Rate:  [51-116] 62 (12/17 0700) Resp:  [14-49] 20 (12/17 0700) BP: (68-201)/(22-109) 100/44 mmHg (12/17 0700) SpO2:  [96 %-100 %] 100 % (12/17 0700) FiO2 (%):  [40 %-100 %] 40 % (12/17 0700) Weight:  [67.7 kg (149 lb 4 oz)] 67.7 kg (149 lb 4 oz) (12/17 0613) HEMODYNAMICS: CVP:  [5 mmHg-11 mmHg] 9 mmHg VENTILATOR SETTINGS: Vent Mode:  [-] PRVC FiO2 (%):  [40 %-100 %] 40 % Set Rate:  [14 bmp-20 bmp] 20 bmp Vt Set:  [420 mL] 420 mL PEEP:  [5 cmH20] 5 cmH20 Plateau Pressure:  [27 cmH20-28 cmH20] 28 cmH20 INTAKE / OUTPUT: Intake/Output     12/16 0701 - 12/17 0700 12/17 0701 - 12/18 0700   P.O.     I.V. (mL/kg) 2745.4 (40.6)    IV Piggyback 550    Total Intake(mL/kg) 3295.4 (48.7)    Urine (mL/kg/hr) 1330 (0.8)    Total Output 1330     Net +1965.4             PHYSICAL EXAMINATION:  General: comfortable on vent Neuro:  Awake on vent, following commands HEENT:  NCAT, PERRL, ETT in place Cardiovascular:  RRR, no murmur, positive gallop, no JVD  Lungs:  Crackles in bases bilaterally Abdomen:  BS+, soft, nontender Musculoskeletal:  Normal bulk and tone Skin:  No rash or breakdown Ext: warm, no cyanosis or clubbing; trace pretibial edema  LABS:  CBC  Recent Labs Lab 02/15/13 0359 02/17/13 0100 02/18/13 0422  WBC 10.9* 20.1* 23.2*  HGB 11.0* 12.0 11.3*  HCT 33.1* 35.0* 34.1*  PLT 350 338 364   Coag's No results found for this basename: APTT, INR,  in the last 168 hours BMET  Recent Labs Lab 02/17/13 0100 02/17/13 1510 02/18/13 0422  NA 132* 133* 133*  K 3.7 3.8 4.0  CL 98 98 100  CO2 24 25 24   BUN 21 23 26*  CREATININE 1.45* 1.36* 1.26*  GLUCOSE 195* 245* 150*   Electrolytes  Recent Labs Lab 02/17/13 0100 02/17/13 1510 02/18/13 0422  CALCIUM 8.2* 8.3* 8.2*   Sepsis Markers  Recent Labs Lab 02/04/2013 1950 02/23/2013 2016 02/15/13 0359 02/16/13 0349 02/17/13 0929 02/18/13 0422  LATICACIDVEN  --  2.27* 1.0  --  1.3  --   PROCALCITON 0.15  --   --  1.41  --  3.08   ABG  Recent  Labs Lab 02/17/13 1230 02/17/13 1409 02/18/13 0430  PHART 7.276* 7.399 7.389  PCO2ART 60.1* 39.1 37.5  PO2ART 359.0* 69.2* 82.8   Liver Enzymes  Recent Labs Lab 02/27/2013 1950  AST 20  ALT 7  ALKPHOS 71  BILITOT 0.3  ALBUMIN 2.2*   Cardiac Enzymes  Recent Labs Lab 02/15/13 0359  02/17/13 1510 02/17/13 2228 02/18/13 0241  TROPONINI  --   < > 1.76* 1.12* 0.68*  PROBNP 1168.0*  --   --   --   --   < > = values in this interval not displayed. Glucose  Recent Labs Lab 02/17/13 2006 02/17/13 2313 02/18/13 0355  GLUCAP 196* 192* 122*     EKG: NSR, no ST wave changes  12/16 CXR: bilateral airspace disease, perhaps trace pleural effusion  ASSESSMENT / PLAN:  PULMONARY A:  Acute hypoxemic respiratory  failure due to pneumonia, ARDS Severe Community Acquired Pneumonia P:   -full vent support -given worsening pressor requirement, no SBT today -daily CXR/ABG  CARDIOVASCULAR A:  Septic shock NSTEMI >demand ischaemia P:  -tele -give 500cc and repeat CVP -continue levophed, vaso -asa, heparin -f/u cardiology consult today  RENAL A:   AKI > improving P:   -continue IVF for now  GASTROINTESTINAL A:   No acute issues P:   -Pantoprazole for stress ulcer prophylaxis -OG tube -start tube feedings today  HEMATOLOGIC A:  No acute issues P:  -monitor cbc  INFECTIOUS A:   Severe Community acquired pneumonia > ddx includes viral pneumonia (strong suspicion given preceding viral illness, works in Aud[employer], and widespread activity in [place]) vs bacterial  UTI > culture negative  P:   -cont current abx -f/u repeat cultures -empiric tamiflu  ENDOCRINE A:   No acute issues P:   -monitor glucose  NEUROLOGIC A:   No acute issues P:   -sedation with fentanyl gtt titrated to RASS -1  TODAY'S SUMMARY:   Code status: lengthy conversation with patient and family, short term aggressive care, intubation OK  I have personally obtained a history, examined the patient, evaluated laboratory and imaging results, formulated the assessment and plan and placed orders. CRITICAL CARE: The patient is critically ill with multiple organ systems failure and requires high complexity decision making for assessment and support, frequent evaluation and titration of therapies, application of advanced monitoring technologies and extensive interpretation of multiple databases. Critical Care Time devoted to patient care services described in this note is 45 minutes.    Yolonda Kida PCCM Pager: [phone] Cell: [phone] If no response, call [phone]   02/18/2013, 7:57 AM

## 2013-02-18 NOTE — Progress Notes (Signed)
ANTICOAGULATION CONSULT NOTE - Follow Up Consult  Pharmacy Consult for IV heparin Indication: r/o ACS/STEMI  Labs:  Recent Labs  02/17/13 0100  02/17/13 1510 02/17/13 2228 02/18/13 0241 02/18/13 0422 02/18/13 1123 02/18/13 1755  HGB 12.0  --   --   --   --  11.3*  --   --   HCT 35.0*  --   --   --   --  34.1*  --   --   PLT 338  --   --   --   --  364  --   --   HEPARINUNFRC  --   --   --   --  0.17*  --  0.35 0.29*  CREATININE 1.45*  --  1.36*  --   --  1.26*  --   --   TROPONINI  --   < > 1.76* 1.12* 0.68*  --   --   --   < > = values in this interval not displayed.   Assessment: 4 yoF admitted on 12/13 with acute hypoxemic respiratory failure after a viral illness. PCCM consulted on 12/16 for worsening respiratory distress, renal failure and hypotension required intubation 12/16. Troponin noted to be elevated at 1.76, MD thinks likely demand ischemia, starting IV heparin pending cardiology consult.   IV heparin currently running at 9.5 ml/hr.  No interruptions or complications per RN.  Heparin level just slightly sub-therapeutic at 0.29   Goal of Therapy:  Heparin level 0.3-0.7 units/ml Monitor platelets by anticoagulation protocol: Yes   Plan:   Increase to heparin IV infusion at 1050 units/hr (10.5 ml/hr)  Heparin level in 8 hours after rate change  Daily heparin level and CBC   F/u cardiology consult   Lynann Beaver PharmD, BCPS Pager [phone] 02/18/2013 5:46 PM

## 2013-02-18 NOTE — Progress Notes (Signed)
ANTICOAGULATION CONSULT NOTE - Follow Up Consult  Pharmacy Consult for IV heparin Indication: r/o ACS/STEMI  No Known Allergies  Patient Measurements: Height: 5\' 3"  (160 cm) Weight: 149 lb 4 oz (67.7 kg) IBW/kg (Calculated) : 52.4 Heparin Dosing Weight: 66kg  Labs:  Recent Labs  02/17/13 0100  02/17/13 1510 02/17/13 2228 02/18/13 0241 02/18/13 0422 02/18/13 1123  HGB 12.0  --   --   --   --  11.3*  --   HCT 35.0*  --   --   --   --  34.1*  --   PLT 338  --   --   --   --  364  --   HEPARINUNFRC  --   --   --   --  0.17*  --  0.35  CREATININE 1.45*  --  1.36*  --   --  1.26*  --   TROPONINI  --   < > 1.76* 1.12* 0.68*  --   --   < > = values in this interval not displayed.  Estimated Creatinine Clearance: 28.5 ml/min (by C-G formula based on Cr of 1.26).   Assessment: 67 yoF admitted on 12/13 with acute hypoxemic respiratory failure after a viral illness. PCCM consulted on 12/16 for worsening respiratory distress, renal failure and hypotension required intubation 12/16. Troponin noted to be elevated at 1.76, MD thinks likely demand ischemia, starting IV heparin pending cardiology consult.   IV heparin currently running at 950 units/hr. Heparin level therapeutic at 0.35. Troponin trending down some. CBC okay, no bleeding. Awaiting cards consult.   Goal of Therapy:  Heparin level 0.3-0.7 units/ml Monitor platelets by anticoagulation protocol: Yes   Plan:   Continue IV heparin at 950 units/hr  Confirmatory heparin level at 1800  Daily heparin level and CBC   F/u cardiology consult  Geoffry Paradise, PharmD, BCPS Pager: [phone] 12:15 PM Pharmacy #: [id number]

## 2013-02-18 NOTE — Progress Notes (Signed)
ANTICOAGULATION CONSULT NOTE - Follow Up Consult  Pharmacy Consult for Heparin Indication: chest pain/ACS  No Known Allergies  Patient Measurements: Height: 5\' 3"  (160 cm) Weight: 145 lb 11.6 oz (66.1 kg) IBW/kg (Calculated) : 52.4 Heparin Dosing Weight:   Vital Signs: Temp: 96.4 F (35.8 C) (12/17 0500) Temp src: Core (Comment) (12/17 0445) BP: 83/41 mmHg (12/17 0500) Pulse Rate: 55 (12/17 0500)  Labs:  Recent Labs  02/17/13 0100  02/17/13 1510 02/17/13 2228 02/18/13 0241 02/18/13 0422  HGB 12.0  --   --   --   --  11.3*  HCT 35.0*  --   --   --   --  34.1*  PLT 338  --   --   --   --  364  HEPARINUNFRC  --   --   --   --  0.17*  --   CREATININE 1.45*  --  1.36*  --   --  1.26*  TROPONINI  --   < > 1.76* 1.12* 0.68*  --   < > = values in this interval not displayed.  Estimated Creatinine Clearance: 28.2 ml/min (by C-G formula based on Cr of 1.26).   Medications:  Infusions:  . sodium chloride 1,000 mL (02/17/13 2010)  . fentaNYL infusion INTRAVENOUS 75 mcg/hr (02/17/13 1900)  . heparin 950 Units/hr (02/18/13 0330)  . norepinephrine (LEVOPHED) Adult infusion 4 mcg/min (02/18/13 0447)  . vasopressin (PITRESSIN) infusion - *FOR SHOCK* 0.03 Units/min (02/17/13 2009)    Assessment: Patient with low heparin level.  No issues per RN.  Goal of Therapy:  Heparin level 0.3-0.7 units/ml Monitor platelets by anticoagulation protocol: Yes   Plan:  Increase heparin to 950 units/hr, recheck heparin level at 1130am  Darlina Guys, Jacquenette Shone Crowford 02/18/2013,6:05 AM

## 2013-02-19 ENCOUNTER — Inpatient Hospital Stay (HOSPITAL_COMMUNITY): Payer: Medicare Other

## 2013-02-19 LAB — CBC WITH DIFFERENTIAL/PLATELET
Basophils Absolute: 0 10*3/uL (ref 0.0–0.1)
Eosinophils Relative: 0 % (ref 0–5)
Hemoglobin: 10.7 g/dL — ABNORMAL LOW (ref 12.0–15.0)
Lymphocytes Relative: 5 % — ABNORMAL LOW (ref 12–46)
Lymphs Abs: 1.1 10*3/uL (ref 0.7–4.0)
MCV: 95.2 fL (ref 78.0–100.0)
Monocytes Relative: 7 % (ref 3–12)
Neutro Abs: 18.5 10*3/uL — ABNORMAL HIGH (ref 1.7–7.7)
Neutrophils Relative %: 88 % — ABNORMAL HIGH (ref 43–77)
Platelets: 384 10*3/uL (ref 150–400)
RBC: 3.3 MIL/uL — ABNORMAL LOW (ref 3.87–5.11)
RDW: 14 % (ref 11.5–15.5)
WBC: 21.1 10*3/uL — ABNORMAL HIGH (ref 4.0–10.5)

## 2013-02-19 LAB — BLOOD GAS, ARTERIAL
Acid-base deficit: 2.6 mmol/L — ABNORMAL HIGH (ref 0.0–2.0)
Drawn by: 11249
FIO2: 0.4 %
MECHVT: 420 mL
O2 Saturation: 96.4 %
PEEP: 5 cmH2O
Patient temperature: 98.6
RATE: 20 resp/min

## 2013-02-19 LAB — BASIC METABOLIC PANEL
BUN: 35 mg/dL — ABNORMAL HIGH (ref 6–23)
CO2: 22 mEq/L (ref 19–32)
Creatinine, Ser: 1.06 mg/dL (ref 0.50–1.10)
GFR calc Af Amer: 53 mL/min — ABNORMAL LOW (ref >90)
GFR calc non Af Amer: 45 mL/min — ABNORMAL LOW (ref >90)
Glucose, Bld: 215 mg/dL — ABNORMAL HIGH (ref 70–99)

## 2013-02-19 LAB — GLUCOSE, CAPILLARY
Glucose-Capillary: 194 mg/dL — ABNORMAL HIGH (ref 70–99)
Glucose-Capillary: 166 mg/dL — ABNORMAL HIGH (ref 70–99)
Glucose-Capillary: 158 mg/dL — ABNORMAL HIGH (ref 70–99)
Glucose-Capillary: 148 mg/dL — ABNORMAL HIGH (ref 70–99)

## 2013-02-19 LAB — HEPARIN LEVEL (UNFRACTIONATED): Heparin Unfractionated: 0.29 IU/mL — ABNORMAL LOW (ref 0.30–0.70)

## 2013-02-19 MED ORDER — VANCOMYCIN HCL IN DEXTROSE 750-5 MG/150ML-% IV SOLN
750.0000 mg | INTRAVENOUS | Status: DC
  Administered 2013-02-19: 750 mg via INTRAVENOUS
  Filled 2013-02-19 (×2): qty 150

## 2013-02-19 MED ORDER — BIOTENE DRY MOUTH MT LIQD
15.0000 mL | Freq: Four times a day (QID) | OROMUCOSAL | Status: DC
  Administered 2013-02-19 – 2013-02-28 (×26): 15 mL via OROMUCOSAL

## 2013-02-19 MED ORDER — HEPARIN (PORCINE) IN NACL 100-0.45 UNIT/ML-% IJ SOLN
1200.0000 [IU]/h | INTRAMUSCULAR | Status: DC
  Filled 2013-02-19: qty 250

## 2013-02-19 MED ORDER — FUROSEMIDE 10 MG/ML IJ SOLN
40.0000 mg | Freq: Four times a day (QID) | INTRAMUSCULAR | Status: AC
  Administered 2013-02-19 (×2): 40 mg via INTRAVENOUS
  Filled 2013-02-19: qty 4

## 2013-02-19 MED ORDER — PHENYLEPHRINE HCL 10 MG/ML IJ SOLN
30.0000 ug/min | INTRAVENOUS | Status: DC
  Administered 2013-02-19 (×2): 30 ug/min via INTRAVENOUS
  Administered 2013-02-20: 25 ug/min via INTRAVENOUS
  Administered 2013-02-20: 35 ug/min via INTRAVENOUS
  Filled 2013-02-19 (×4): qty 1

## 2013-02-19 NOTE — Progress Notes (Signed)
ANTICOAGULATION CONSULT NOTE - Follow Up Consult  Pharmacy Consult for IV heparin Indication: r/o ACS/STEMI  Labs:  Recent Labs  02/17/13 0100  02/17/13 1510 02/17/13 2228  02/18/13 0241 02/18/13 0422 02/18/13 1123 02/18/13 1755 02/19/13 0345  HGB 12.0  --   --   --   --   --  11.3*  --   --   --   HCT 35.0*  --   --   --   --   --  34.1*  --   --   --   PLT 338  --   --   --   --   --  364  --   --   --   HEPARINUNFRC  --   --   --   --   < > 0.17*  --  0.35 0.29* 0.29*  CREATININE 1.45*  --  1.36*  --   --   --  1.26*  --   --   --   TROPONINI  --   < > 1.76* 1.12*  --  0.68*  --   --   --   --   < > = values in this interval not displayed.   Assessment: 14 yoF admitted on 12/13 with acute hypoxemic respiratory failure after a viral illness. PCCM consulted on 12/16 for worsening respiratory distress, renal failure and hypotension required intubation 12/16. Troponin noted to be elevated at 1.76, MD thinks likely demand ischemia, starting IV heparin pending cardiology consult.   IV heparin currently running at 10.5 ml/hr.  No interruptions or complications per RN.  Heparin level just slightly sub-therapeutic at 0.29.    Goal of Therapy:  Heparin level 0.3-0.7 units/ml Monitor platelets by anticoagulation protocol: Yes   Plan:   Increase to heparin IV infusion at 1200 units/hr (12 ml/hr)  Heparin level in 8 hours after rate change  Daily heparin level and CBC   F/u cardiology consult   Lorenza Evangelist 02/19/2013 5:33 AM

## 2013-02-19 NOTE — Progress Notes (Signed)
eLink Physician-Brief Progress Note Patient Name: Alicia Yoder DOB: [birth year] MRN: [id number]  Date of Service  02/19/2013   HPI/Events of Note   Hypotensive, tachy on levo  eICU Interventions  Neo order sent      Giavanni Odonovan 02/19/2013, 3:42 PM

## 2013-02-19 NOTE — Progress Notes (Signed)
[id number] Alicia Plater, RN, BSN, [place] [phone] Chart Reviewed for discharge and hospital needs.  Patient remains vent dependant.   Discharge needs at time of review:  None present will follow for needs. Review of patient progress due on [id number].

## 2013-02-19 NOTE — Progress Notes (Addendum)
ANTIBIOTIC CONSULT NOTE - FOLLOW UP  Pharmacy Consult for Vancomycin, Cefepime Indication: severe CAP  No Known Allergies  Labs:  Recent Labs  02/17/13 0100 02/17/13 1243 02/17/13 1510 02/18/13 0422 02/19/13 0400  WBC 20.1*  --   --  23.2* 21.1*  HGB 12.0  --   --  11.3* 10.7*  PLT 338  --   --  364 384  LABCREA  --  27.32  --   --   --   CREATININE 1.45*  --  1.36* 1.26* 1.06   Estimated Creatinine Clearance: 35 ml/min (by C-G formula based on Cr of 1.06).   Assessment: 30 yoF presented 12/13 with SOB. CXR with diffuse airspace dz (2/2 pulmonary edema). Levaquin start for r/o PNA. CXR 12/14 with increased bilateral pulmonary opacities, cannot r/o infectious process and pt with incr WOB - MD added Vancomycin for suspected PNA. Overnight, pt with persistent tachypnea with hypoxemia, fever, increased leukocytosis .Marland Kitchen Levaquin discontinued and starting Cefepime. Same day, CCM consulted - pt intubated and viral/atypical coverage added.   12/13 >> Levaquin >> 12/16 12/15 >> Vanc >>  12/16 >> Cefepime >>  12/16 >> Azithromycin >>  12/16 >> Tamiflu >>   Tmax: afeb since 12/16 WBCs: 21.1K, starting to improve Renal: Scr improved to 1.06, CG 35, N 42 PCT up to 3.08 << 1.41 << 0.15  12/13 blood x 2 >> NGTD 12/13 MRSA PCR >> negative 12/14 influenza panel >> negative 12/14 urine >> NGF 12/14 legionella/strep pneumo ag >> negative 12/16 blood x 2 >> NGTD 12/16 respiratory >> pending 12/16 viral >> pending 12/16 respiratory virus panel >> Rhinovirus  Today is D#6 abx, D#4 Vanc, D#3 Cefepime, D#3 Azithro, D#3/5 Tamiflu for severe CAP, atypical coverage and empiric influenza coverage. Rhinovirus on respiratory virus panel. Currently, no antiviral therapy is indicated for rhinovirus. F/u other cultures. Scr improved.   Goal of Therapy:  Vancomycin trough level 15-20 mcg/ml Appropriate renal adjustment of abx Eradication of infection  Plan:   Change Vancomycin to 1g IV q24h  for improved renal function  Continue Cefepime 1g IV q24h  MD, consider changing Tamiflu to 30 mg per tube BID for improved renal function if coverage still needed  F/u clinical status and narrowing of antibiotics  Geoffry Paradise, PharmD, BCPS Pager: [phone] 8:09 AM Pharmacy #: [id number]

## 2013-02-19 NOTE — Progress Notes (Signed)
PULMONARY  / CRITICAL CARE MEDICINE  Name: Alicia Yoder MRN: [id number] DOB: [birth year]    ADMISSION DATE:  02/12/2013 CONSULTATION DATE:  02/17/2013   REFERRING MD :  Linnell Fulling PRIMARY SERVICE: TRH  CHIEF COMPLAINT:  Acute respiratory failure  BRIEF PATIENT DESCRIPTION: 79 y/o female with hypertension admitted on 12/13 with acute hypoxemic respiratory failure after a viral illness. PCCM consulted on 12/16 for worsening respiratory distress, renal failure and hypotension.  SIGNIFICANT EVENTS / STUDIES:  12/13 admission 12/16 PCCM consult 12/16 Bronchoscopy > thin secretions LUL, BAL anteriomedial basal segment LLL  LINES / TUBES: 12/16 ETT >> 12/16 L IJ CVL >>   CULTURES: 12/14 urine >> negative 12/13 blood >> negative  12/13 Viral PCR (nasal)>> negative 12/14 urine strep/leg >> negative 12/16 blood >> 12/16 BAL >> 12/16 Viral culture (bal) >> 12/16 viral panel pcr (bal) >> RHINOVIRUS  ANTIBIOTICS: 12/13 levaquin >> 12/15 12/16 vanc >> 12/16 cefepime >> 12/16 Tamiflu >> 12/18 12/16 azithro >>    SUBJECTIVE: Shock improved, renal function improved  VITAL SIGNS: Temp:  [97 F (36.1 C)-98.4 F (36.9 C)] 97.9 F (36.6 C) (12/18 0600) Pulse Rate:  [61-86] 70 (12/18 0600) Resp:  [17-30] 23 (12/18 0600) BP: (95-143)/(35-78) 125/47 mmHg (12/18 0600) SpO2:  [100 %] 100 % (12/18 0600) FiO2 (%):  [30 %-40 %] 30 % (12/18 0737) Weight:  [72.3 kg (159 lb 6.3 oz)] 72.3 kg (159 lb 6.3 oz) (12/18 0500) HEMODYNAMICS: CVP:  [5 mmHg-11 mmHg] 8 mmHg VENTILATOR SETTINGS: Vent Mode:  [-] PRVC FiO2 (%):  [30 %-40 %] 30 % Set Rate:  [20 bmp] 20 bmp Vt Set:  [420 mL] 420 mL PEEP:  [5 cmH20] 5 cmH20 Plateau Pressure:  [19 cmH20-27 cmH20] 26 cmH20 INTAKE / OUTPUT: Intake/Output     12/17 0701 - 12/18 0700 12/18 0701 - 12/19 0700   I.V. (mL/kg) 2687.3 (37.2)    NG/GT 885.7    IV Piggyback 450    Total Intake(mL/kg) 4022.9 (55.6)    Urine (mL/kg/hr) 505 (0.3)    Total  Output 505     Net +3517.9            PHYSICAL EXAMINATION:  General: comfortable on vent Neuro:  Awake on vent, following commands HEENT:  NCAT, PERRL, ETT in place Cardiovascular:  RRR, no murmur,  JVD noted Lungs:  Crackles in bases bilaterally Abdomen:  BS+, soft, nontender Musculoskeletal:  Normal bulk and tone Skin:  No rash or breakdown Ext: warm, no cyanosis or clubbing; trace pretibial edema  LABS:  CBC  Recent Labs Lab 02/17/13 0100 02/18/13 0422 02/19/13 0400  WBC 20.1* 23.2* 21.1*  HGB 12.0 11.3* 10.7*  HCT 35.0* 34.1* 31.4*  PLT 338 364 384   Coag's No results found for this basename: APTT, INR,  in the last 168 hours BMET  Recent Labs Lab 02/17/13 1510 02/18/13 0422 02/19/13 0400  NA 133* 133* 131*  K 3.8 4.0 3.7  CL 98 100 99  CO2 25 24 22   BUN 23 26* 35*  CREATININE 1.36* 1.26* 1.06  GLUCOSE 245* 150* 215*   Electrolytes  Recent Labs Lab 02/17/13 1510 02/18/13 0422 02/19/13 0400  CALCIUM 8.3* 8.2* 8.3*   Sepsis Markers  Recent Labs Lab 03/02/2013 1950 03/02/2013 2016 02/15/13 0359 02/16/13 0349 02/17/13 0929 02/18/13 0422  LATICACIDVEN  --  2.27* 1.0  --  1.3  --   PROCALCITON 0.15  --   --  1.41  --  3.08   ABG  Recent Labs Lab 02/17/13 1409 02/18/13 0430 02/19/13 0435  PHART 7.399 7.389 7.386  PCO2ART 39.1 37.5 36.5  PO2ART 69.2* 82.8 85.7   Liver Enzymes  Recent Labs Lab 02/02/2013 1950  AST 20  ALT 7  ALKPHOS 71  BILITOT 0.3  ALBUMIN 2.2*   Cardiac Enzymes  Recent Labs Lab 02/15/13 0359  02/17/13 1510 02/17/13 2228 02/18/13 0241  TROPONINI  --   < > 1.76* 1.12* 0.68*  PROBNP 1168.0*  --   --   --   --   < > = values in this interval not displayed. Glucose  Recent Labs Lab 02/18/13 1200 02/18/13 1601 02/18/13 2013 02/18/13 2356 02/19/13 0416 02/19/13 0730  GLUCAP 177* 185* 169* 192* 194* 166*     EKG: NSR, no ST wave changes  12/16 CXR: bilateral airspace disease, perhaps trace pleural  effusion  ASSESSMENT / PLAN:  PULMONARY A:  Acute hypoxemic respiratory failure due to pneumonia, ARDS; perhaps some cardiogenic pulm edema now with  Severe Community Acquired Pneumonia P:   -SBT this morning, goal 2 hours -daily CXR/ABG  CARDIOVASCULAR A:  Septic shock NSTEMI >demand ischaemia resolved Likely diastolic dysfunction, pulm edema after aggressive volume resuscitation P:  -tele -d/c heparin -wean levophed to off -continue ASA -will need cardiac stress test within a month -KVO fluids -lasix when off levophed  RENAL A:   AKI > resolved P:   -kvo fluids -daily BMET  GASTROINTESTINAL A:   No acute issues P:   -Pantoprazole for stress ulcer prophylaxis -OG tube -continue tube feedings  HEMATOLOGIC A:  No acute issues P:  -monitor cbc  INFECTIOUS A:   Severe Community acquired pneumonia > RHINOVIRUS isolated from BAL (case reports in literature noting severe CAP in older adults, mostly Asian sources); suspect bacterial superinfection given timecourse  UTI > culture negative  P:   -cont current abx -f/u repeat cultures -d/c resp isolation -d/c tamiflu  ENDOCRINE A:   No acute issues P:   -monitor glucose  NEUROLOGIC A:   No acute issues P:   -sedation with fentanyl gtt titrated to RASS -1  TODAY'S SUMMARY:   Code status: 12/16 lengthy conversation with patient and family, short term aggressive care, intubation OK  I have personally obtained a history, examined the patient, evaluated laboratory and imaging results, formulated the assessment and plan and placed orders. CRITICAL CARE: The patient is critically ill with multiple organ systems failure and requires high complexity decision making for assessment and support, frequent evaluation and titration of therapies, application of advanced monitoring technologies and extensive interpretation of multiple databases. Critical Care Time devoted to patient care services described in this  note is 45 minutes.    Alicia Yoder PCCM Pager: [phone] Cell: [phone] If no response, call [phone]   02/19/2013, 8:36 AM

## 2013-02-20 LAB — CBC WITH DIFFERENTIAL/PLATELET
Basophils Absolute: 0 10*3/uL (ref 0.0–0.1)
Basophils Relative: 0 % (ref 0–1)
HCT: 34.2 % — ABNORMAL LOW (ref 36.0–46.0)
Hemoglobin: 11.4 g/dL — ABNORMAL LOW (ref 12.0–15.0)
Lymphocytes Relative: 6 % — ABNORMAL LOW (ref 12–46)
Lymphs Abs: 1.2 10*3/uL (ref 0.7–4.0)
MCH: 31.5 pg (ref 26.0–34.0)
Monocytes Absolute: 2 10*3/uL — ABNORMAL HIGH (ref 0.1–1.0)
Monocytes Relative: 10 % (ref 3–12)
Neutro Abs: 17.1 10*3/uL — ABNORMAL HIGH (ref 1.7–7.7)
Neutrophils Relative %: 84 % — ABNORMAL HIGH (ref 43–77)
Platelets: 380 10*3/uL (ref 150–400)
RBC: 3.62 MIL/uL — ABNORMAL LOW (ref 3.87–5.11)
WBC: 20.3 10*3/uL — ABNORMAL HIGH (ref 4.0–10.5)

## 2013-02-20 LAB — BASIC METABOLIC PANEL
BUN: 42 mg/dL — ABNORMAL HIGH (ref 6–23)
CO2: 21 mEq/L (ref 19–32)
Chloride: 102 mEq/L (ref 96–112)
GFR calc non Af Amer: 45 mL/min — ABNORMAL LOW (ref >90)
Glucose, Bld: 168 mg/dL — ABNORMAL HIGH (ref 70–99)
Potassium: 3.5 mEq/L (ref 3.5–5.1)
Sodium: 134 mEq/L — ABNORMAL LOW (ref 135–145)

## 2013-02-20 LAB — CULTURE, RESPIRATORY W GRAM STAIN: Special Requests: NORMAL

## 2013-02-20 LAB — GLUCOSE, CAPILLARY
Glucose-Capillary: 120 mg/dL — ABNORMAL HIGH (ref 70–99)
Glucose-Capillary: 146 mg/dL — ABNORMAL HIGH (ref 70–99)
Glucose-Capillary: 150 mg/dL — ABNORMAL HIGH (ref 70–99)
Glucose-Capillary: 154 mg/dL — ABNORMAL HIGH (ref 70–99)
Glucose-Capillary: 166 mg/dL — ABNORMAL HIGH (ref 70–99)

## 2013-02-20 MED ORDER — LEVOFLOXACIN IN D5W 750 MG/150ML IV SOLN
750.0000 mg | INTRAVENOUS | Status: DC
  Administered 2013-02-20 – 2013-02-22 (×2): 750 mg via INTRAVENOUS
  Filled 2013-02-20 (×2): qty 150

## 2013-02-20 MED ORDER — HYDROCORTISONE SOD SUCCINATE 100 MG IJ SOLR
50.0000 mg | Freq: Two times a day (BID) | INTRAMUSCULAR | Status: DC
  Administered 2013-02-20 – 2013-02-21 (×2): 50 mg via INTRAVENOUS
  Filled 2013-02-20 (×2): qty 1

## 2013-02-20 MED ORDER — FUROSEMIDE 10 MG/ML IJ SOLN
40.0000 mg | Freq: Once | INTRAMUSCULAR | Status: AC
  Administered 2013-02-20: 40 mg via INTRAVENOUS
  Filled 2013-02-20: qty 4

## 2013-02-20 MED ORDER — VITAMINS A & D EX OINT
TOPICAL_OINTMENT | CUTANEOUS | Status: AC
  Administered 2013-02-20: 05:00:00
  Filled 2013-02-20: qty 5

## 2013-02-20 MED ORDER — INSULIN ASPART 100 UNIT/ML ~~LOC~~ SOLN
2.0000 [IU] | Freq: Three times a day (TID) | SUBCUTANEOUS | Status: DC
  Administered 2013-02-20: 2 [IU] via SUBCUTANEOUS
  Administered 2013-02-20: 4 [IU] via SUBCUTANEOUS
  Administered 2013-02-21 – 2013-02-22 (×3): 2 [IU] via SUBCUTANEOUS
  Administered 2013-02-22 – 2013-02-24 (×4): 4 [IU] via SUBCUTANEOUS
  Administered 2013-02-25: 08:00:00 2 [IU] via SUBCUTANEOUS

## 2013-02-20 MED ORDER — FENTANYL CITRATE 0.05 MG/ML IJ SOLN: 12.5000 ug | INTRAMUSCULAR | Status: DC | PRN

## 2013-02-20 MED ORDER — POTASSIUM CHLORIDE 20 MEQ/15ML (10%) PO LIQD
40.0000 meq | Freq: Once | ORAL | Status: AC
  Administered 2013-02-20: 40 meq via ORAL
  Filled 2013-02-20: qty 30

## 2013-02-20 NOTE — Progress Notes (Signed)
Patients heart rate sustained in the 130's since approximately 4 this afternoon. MD aware. Given orders to obtain 12-lead EKG to confirm rhythm of sinus tachycardia. No new orders given post EKG. Will continue to monitor.

## 2013-02-20 NOTE — Evaluation (Signed)
Physical Therapy Evaluation Patient Details Name: Alicia Yoder MRN: [id number] DOB: 04-03-1922 Today's Date: 02/20/2013 Time: 8295-6213 PT Time Calculation (min): 29 min  PT Assessment / Plan / Recommendation History of Present Illness    79 y/o female with hypertension admitted on 12/13 with acute hypoxemic respiratory failure after a viral illness. PCCM consulted on 12/16 for worsening respiratory distress, renal failure and hypotension.   Clinical Impression  Pt extubated from vent this date and demonstrating functional mobility limitations 2* to generalized weakness and SOB with c/o dizziness with minimal mobilization.  Pt will greatly benefit from PT intervention during acute stay and hopes to progress to d/c home with niece.    PT Assessment  Patient needs continued PT services    Follow Up Recommendations  Home health PT    Does the patient have the potential to tolerate intense rehabilitation      Barriers to Discharge        Equipment Recommendations  Rolling walker with 5" wheels    Recommendations for Other Services OT consult   Frequency Min 3X/week    Precautions / Restrictions Precautions Precautions: Fall Restrictions Weight Bearing Restrictions: No   Pertinent Vitals/Pain No specific c/o pain      Mobility  Bed Mobility Bed Mobility: Supine to Sit;Sit to Supine Supine to Sit: 1: +2 Total assist;HOB elevated;With rails Supine to Sit: Patient Percentage: 50% Sit to Supine: 1: +2 Total assist;HOB flat Sit to Supine: Patient Percentage: 40% Details for Bed Mobility Assistance: cues for sequencing and use of LEs and UEs to self assist Transfers Transfers: Not assessed (Pt with c/o dizziness in sitting) Ambulation/Gait Ambulation/Gait Assistance: Not tested (comment) (Pt with c/o dizziness with sitting)    Exercises General Exercises - Lower Extremity Ankle Circles/Pumps: AROM;15 reps;Supine;Both   PT Diagnosis: Difficulty walking;Generalized  weakness  PT Problem List: Decreased strength;Decreased range of motion;Decreased activity tolerance;Decreased mobility;Decreased knowledge of use of DME PT Treatment Interventions: DME instruction;Gait training;Stair training;Therapeutic activities;Therapeutic exercise;Balance training;Patient/family education     PT Goals(Current goals can be found in the care plan section) Acute Rehab PT Goals Patient Stated Goal: Home with niece to recover PT Goal Formulation: With patient Time For Goal Achievement: 03/06/13 Potential to Achieve Goals: Good  Visit Information  Last PT Received On: 02/20/13 Assistance Needed: +2       Prior Functioning  Home Living Family/patient expects to be discharged to:: Private residence Living Arrangements: Other relatives Available Help at Discharge: Family Type of Home: House Home Access: Stairs to enter Secretary/administrator of Steps: 0 Home Layout: One level Home Equipment: None Additional Comments: Pt plans d/c home with niece.   Prior Function Level of Independence: Independent Communication Communication: No difficulties    Cognition  Cognition Arousal/Alertness: Awake/alert Behavior During Therapy: WFL for tasks assessed/performed Overall Cognitive Status: Within Functional Limits for tasks assessed    Extremity/Trunk Assessment Upper Extremity Assessment Upper Extremity Assessment: Generalized weakness Lower Extremity Assessment Lower Extremity Assessment: Generalized weakness   Balance Static Sitting Balance Static Sitting - Balance Support: No upper extremity supported;Feet supported;Feet unsupported Static Sitting - Level of Assistance: 4: Min assist;5: Stand by assistance Static Sitting - Comment/# of Minutes: 10  End of Session PT - End of Session Activity Tolerance: Patient limited by fatigue;Other (comment) Patient left: in bed;with call bell/phone within reach;with nursing/sitter in room;with family/visitor present Nurse  Communication: Mobility status  GP     Katelyn Kohlmeyer 02/20/2013, 3:53 PM

## 2013-02-20 NOTE — Progress Notes (Signed)
Name: Alicia Yoder MRN: [id number] DOB: [birth year]    ADMISSION DATE:  02/16/2013 CONSULTATION DATE:  02/17/2013   REFERRING MD :  Linnell Fulling PRIMARY SERVICE: TRH  CHIEF COMPLAINT:  Acute respiratory failure  BRIEF PATIENT DESCRIPTION: 78 y/o female with hypertension admitted on 12/13 with acute hypoxemic respiratory failure after a viral illness. PCCM consulted on 12/16 for worsening respiratory distress, renal failure and hypotension.  SIGNIFICANT EVENTS / STUDIES:  12/13 admission 12/16 PCCM consult 12/16 Bronchoscopy > thin secretions LUL, BAL anteriomedial basal segment LLL  LINES / TUBES: 12/16 ETT >>12/19 12/16 L IJ CVL >>   CULTURES: 12/14 urine >> negative 12/13 blood >> negative  12/13 Viral PCR (nasal) negative >>Rhinivirus + 12/16 blood >> 12/16 BAL >>normal flora 12/16 Viral culture (bal) >>  ANTIBIOTICS: 12/13 levaquin >> 12/15 12/16 vanc >>12/19 12/16 cefepime >>12/19 12/16 Tamiflu >>12/18 12/16 azithro >>>12/19 12/19 levaquin>>>    SUBJECTIVE: Off vent. Weaning pressors   VITAL SIGNS: Temp:  [97.7 F (36.5 C)-99.1 F (37.3 C)] 97.7 F (36.5 C) (12/19 0800) Pulse Rate:  [45-159] 80 (12/19 0800) Resp:  [18-37] 18 (12/19 0800) BP: (89-149)/(30-58) 114/48 mmHg (12/19 0800) SpO2:  [100 %] 100 % (12/19 0800) FiO2 (%):  [30 %] 30 % (12/19 0830) Weight:  [72.4 kg (159 lb 9.8 oz)] 72.4 kg (159 lb 9.8 oz) (12/19 0500) HEMODYNAMICS:   VENTILATOR SETTINGS: Vent Mode:  [-] CPAP FiO2 (%):  [30 %] 30 % Set Rate:  [20 bmp] 20 bmp Vt Set:  [420 mL] 420 mL PEEP:  [5 cmH20] 5 cmH20 Pressure Support:  [5 cmH20] 5 cmH20 Plateau Pressure:  [17 cmH20-27 cmH20] 23 cmH20 INTAKE / OUTPUT: Intake/Output     12/18 0701 - 12/19 0700 12/19 0701 - 12/20 0700   I.V. (mL/kg) 1374.6 (19) 72.5 (1)   NG/GT 1110 76.7   IV Piggyback 450    Total Intake(mL/kg) 2934.6 (40.5) 149.2 (2.1)   Urine (mL/kg/hr) 1660 (1) 30 (0.1)   Total Output 1660 30   Net +1274.6  +119.2        Stool Occurrence 1 x      PHYSICAL EXAMINATION:  General: comfortable s/p extubation Neuro:  Awake and alert on vent  HEENT:  NCAT, PERRL Cardiovascular:  RRR, no murmur, positive gallop, no JVD  Lungs:  Crackles in bases bilaterally Abdomen:  BS+, soft, nontender Musculoskeletal:  Normal bulk and tone Skin:  No rash or breakdown Ext: warm, no cyanosis or clubbing; trace pretibial edema  LABS:  CBC  Recent Labs Lab 02/18/13 0422 02/19/13 0400 02/20/13 0414  WBC 23.2* 21.1* 20.3*  HGB 11.3* 10.7* 11.4*  HCT 34.1* 31.4* 34.2*  PLT 364 384 380   Coag's No results found for this basename: APTT, INR,  in the last 168 hours BMET  Recent Labs Lab 02/18/13 0422 02/19/13 0400 02/20/13 0414  NA 133* 131* 134*  K 4.0 3.7 3.5  CL 100 99 102  CO2 24 22 21   BUN 26* 35* 42*  CREATININE 1.26* 1.06 1.06  GLUCOSE 150* 215* 168*   Electrolytes  Recent Labs Lab 02/18/13 0422 02/19/13 0400 02/20/13 0414  CALCIUM 8.2* 8.3* 8.1*   Sepsis Markers  Recent Labs Lab 02/07/2013 1950 02/02/2013 2016 02/15/13 0359 02/16/13 0349 02/17/13 0929 02/18/13 0422  LATICACIDVEN  --  2.27* 1.0  --  1.3  --   PROCALCITON 0.15  --   --  1.41  --  3.08   ABG  Recent Labs Lab 02/17/13  1409 02/18/13 0430 02/19/13 0435  PHART 7.399 7.389 7.386  PCO2ART 39.1 37.5 36.5  PO2ART 69.2* 82.8 85.7   Liver Enzymes  Recent Labs Lab 03-10-13 1950  AST 20  ALT 7  ALKPHOS 71  BILITOT 0.3  ALBUMIN 2.2*   Cardiac Enzymes  Recent Labs Lab 02/15/13 0359  02/17/13 1510 02/17/13 2228 02/18/13 0241  TROPONINI  --   < > 1.76* 1.12* 0.68*  PROBNP 1168.0*  --   --   --   --   < > = values in this interval not displayed. Glucose  Recent Labs Lab 02/19/13 1134 02/19/13 1505 02/19/13 1935 02/20/13 0004 02/20/13 0346 02/20/13 0801  GLUCAP 158* 148* 146* 150* 166* 150*     EKG: NSR, no ST wave changes  12/18 CXR: bilateral airspace disease R>L, perhaps trace  pleural effusion  ASSESSMENT / PLAN:  PULMONARY A:  Acute hypoxemic respiratory failure due to pneumonia, ARDS Viral PNA +/- bacterial super-infection (NOS) Passed SBT and extubated 12/19 P:   -pulm hygiene  -wean FIO2 -see ID section -diuresis as bun/cr and BP allow  -f/u CXR  CARDIOVASCULAR A:  Septic shock, weaning off neo NSTEMI >demand ischemia  freq PACs P:  -tele -wean neo to off (goal MAP >55) -asa, heparin -f/u cardiology as out-pt for stress test  -rapid taper steroids   RENAL A:   AKI > improving P:   -KVO IVF -lasix trial 12/19  GASTROINTESTINAL A:   No acute issues P:   -Pantoprazole for stress ulcer prophylaxis -adv diet as tol  HEMATOLOGIC A:  No acute issues P:  -monitor cbc  INFECTIOUS A:   Viral PNA (+ rhinovirus) +/- bacterial super-infection (NOS)  P:   -d/c vanc, narrow to resp quinolone -f/u repeat cultures  ENDOCRINE A:   No acute issues P:   -monitor glucose -taper steroids  NEUROLOGIC A:  Deconditioning s/p critical illness  P:   PT/OT consults placed   TODAY'S SUMMARY:  Extubated. Should come off pressors. Will try to keep her on the dry side. Increase mobility and d/c vanc. Think she's on the right track.   Attending:  I have seen and examined the patient with nurse practitioner and agree with the note above.    I have personally obtained a history, examined the patient, evaluated laboratory and imaging results, formulated the assessment and plan and placed orders. CRITICAL CARE: The patient is critically ill with multiple organ systems failure and requires high complexity decision making for assessment and support, frequent evaluation and titration of therapies, application of advanced monitoring technologies and extensive interpretation of multiple databases. Critical Care Time devoted to patient care services described in this note is 35 minutes.   Yolonda Kida PCCM Pager: [phone] Cell:  [phone] If no response, call [phone]

## 2013-02-20 NOTE — Progress Notes (Signed)
150mg  of IV fentanyl wasted in sink with Clearence Ped RN

## 2013-02-20 NOTE — Procedures (Signed)
Extubation Procedure Note  Patient Details:   Name: Marquise Lambson DOB: 12-Apr-1922 MRN: [id number]   Airway Documentation:     Evaluation  O2 sats: 100% Complications: none Patient tolerated procedure well. Bilateral Breath Sounds: Clear;Diminished   Pt able to speak  Per CCM order, pt extubated, placed on 2L nasal cannula. Pt was stable throughout the procedure, tolerated well.   Revonda Humphrey 02/20/2013, 10:00 AM

## 2013-02-21 LAB — GLUCOSE, CAPILLARY
Glucose-Capillary: 122 mg/dL — ABNORMAL HIGH (ref 70–99)
Glucose-Capillary: 93 mg/dL (ref 70–99)
Glucose-Capillary: 131 mg/dL — ABNORMAL HIGH (ref 70–99)
Glucose-Capillary: 105 mg/dL — ABNORMAL HIGH (ref 70–99)

## 2013-02-21 LAB — CULTURE, BLOOD (ROUTINE X 2)
Culture: NO GROWTH
Culture: NO GROWTH

## 2013-02-21 LAB — BASIC METABOLIC PANEL
BUN: 47 mg/dL — ABNORMAL HIGH (ref 6–23)
Chloride: 102 mEq/L (ref 96–112)
Creatinine, Ser: 1.19 mg/dL — ABNORMAL HIGH (ref 0.50–1.10)
GFR calc Af Amer: 46 mL/min — ABNORMAL LOW (ref >90)
Glucose, Bld: 137 mg/dL — ABNORMAL HIGH (ref 70–99)
Potassium: 4.2 mEq/L (ref 3.5–5.1)
Sodium: 134 mEq/L — ABNORMAL LOW (ref 135–145)

## 2013-02-21 LAB — CBC WITH DIFFERENTIAL/PLATELET
Basophils Relative: 0 % (ref 0–1)
Eosinophils Relative: 0 % (ref 0–5)
Hemoglobin: 10.9 g/dL — ABNORMAL LOW (ref 12.0–15.0)
Lymphs Abs: 1.2 10*3/uL (ref 0.7–4.0)
MCV: 93.4 fL (ref 78.0–100.0)
Monocytes Relative: 14 % — ABNORMAL HIGH (ref 3–12)
Neutro Abs: 14.2 10*3/uL — ABNORMAL HIGH (ref 1.7–7.7)
Neutrophils Relative %: 80 % — ABNORMAL HIGH (ref 43–77)
Platelets: 319 10*3/uL (ref 150–400)
RBC: 3.46 MIL/uL — ABNORMAL LOW (ref 3.87–5.11)

## 2013-02-21 MED ORDER — SODIUM CHLORIDE 0.9 % IJ SOLN
10.0000 mL | INTRAMUSCULAR | Status: DC | PRN
  Administered 2013-02-22 – 2013-02-23 (×5): 10 mL
  Administered 2013-02-23 – 2013-02-24 (×2): 20 mL
  Administered 2013-02-25 (×4): 10 mL
  Administered 2013-02-27: 20 mL

## 2013-02-21 MED ORDER — FUROSEMIDE 10 MG/ML IJ SOLN
INTRAMUSCULAR | Status: AC
  Administered 2013-02-21: 20 mg
  Filled 2013-02-21: qty 4

## 2013-02-21 MED ORDER — HYDROCORTISONE SOD SUCCINATE 100 MG IJ SOLR
50.0000 mg | Freq: Every day | INTRAMUSCULAR | Status: DC
  Administered 2013-02-22 – 2013-02-23 (×2): 50 mg via INTRAVENOUS
  Filled 2013-02-21 (×2): qty 1

## 2013-02-21 NOTE — Progress Notes (Signed)
Name: Alicia Yoder MRN: [id number] DOB: 10-28-1922    ADMISSION DATE:  03/02/2013 CONSULTATION DATE:  02/17/2013   REFERRING MD :  Linnell Fulling PRIMARY SERVICE: TRH  CHIEF COMPLAINT:  Acute respiratory failure  BRIEF PATIENT DESCRIPTION: 79 y/o female with hypertension admitted on 12/13 with acute hypoxemic respiratory failure after a viral illness. PCCM consulted on 12/16 for worsening respiratory distress, renal failure and hypotension.  LINES / TUBES: 12/16 ETT >>12/19 12/16 L IJ CVL >>   CULTURES: 12/14 urine >> negative 12/13 blood >> negative  12/13 Viral PCR (nasal) negative >>Rhinivirus + 12/16 blood >> 12/16 BAL >>normal flora 12/16 Viral culture (bal) >>  ANTIBIOTICS: 12/13 levaquin >> 12/15 12/16 vanc >>12/19 12/16 cefepime >>12/19 12/16 Tamiflu >>12/18 12/16 azithro >>>12/19 12/19 levaquin>>>   SIGNIFICANT EVENTS / STUDIES:  12/13 admission 12/16 PCCM consult 12/16 Bronchoscopy > thin secretions LUL, BAL anteriomedial basal segment LLL 1219 - extubated. Off pressors    SUBJECTIVE/OVERNIGHT/INTERVAL HX  02/21/13 - denies complaints. Ate. Watching Tv. Niece at bedside. Looks deconditioned  VITAL SIGNS: Temp:  [97.9 F (36.6 C)-99 F (37.2 C)] 97.9 F (36.6 C) (12/20 0600) Pulse Rate:  [54-137] 96 (12/20 0700) Resp:  [23-36] 25 (12/20 0700) BP: (85-135)/(38-82) 107/50 mmHg (12/20 0700) SpO2:  [92 %-100 %] 100 % (12/20 0700) FiO2 (%):  [30 %] 30 % (12/19 1000) Weight:  [72.5 kg (159 lb 13.3 oz)] 72.5 kg (159 lb 13.3 oz) (12/20 0500) HEMODYNAMICS:   VENTILATOR SETTINGS: Vent Mode:  [-]  FiO2 (%):  [30 %] 30 % INTAKE / OUTPUT: Intake/Output     12/19 0701 - 12/20 0700 12/20 0701 - 12/21 0700   P.O. 480    I.V. (mL/kg) 495.5 (6.8)    NG/GT 76.7    IV Piggyback 200    Total Intake(mL/kg) 1252.2 (17.3)    Urine (mL/kg/hr) 830 (0.5)    Stool     Total Output 830     Net +422.2          Stool Occurrence 1 x      PHYSICAL  EXAMINATION:  General: comfortable s/p extubation Neuro:  Awake and alert. Orinted. CAM-ICU neg for delirium HEENT:  NCAT, PERRL Cardiovascular:  RRR, no murmur, positive gallop, no JVD  Lungs:  Crackles in bases bilaterally Abdomen:  BS+, soft, nontender Musculoskeletal:  Normal bulk and tone Skin:  No rash or breakdown Ext: warm, no cyanosis or clubbing; trace pretibial edema  LABS:  PULMONARY  Recent Labs Lab 02/17/13 0727 02/17/13 1230 02/17/13 1409 02/18/13 0430 02/19/13 0435  PHART 7.479* 7.276* 7.399 7.389 7.386  PCO2ART 35.2 60.1* 39.1 37.5 36.5  PO2ART 48.7* 359.0* 69.2* 82.8 85.7  HCO3 25.8* 27.0* 23.7 22.5 21.4  TCO2 23.1 24.6 21.3 20.6 19.8  O2SAT 84.8 99.3 92.7 96.3 96.4    CBC  Recent Labs Lab 02/19/13 0400 02/20/13 0414 02/21/13 0330  HGB 10.7* 11.4* 10.9*  HCT 31.4* 34.2* 32.3*  WBC 21.1* 20.3* 17.8*  PLT 384 380 319    COAGULATION No results found for this basename: INR,  in the last 168 hours  CARDIAC   Recent Labs Lab 02/17/13 0929 02/17/13 1510 02/17/13 2228 02/18/13 0241  TROPONINI <0.30 1.76* 1.12* 0.68*    Recent Labs Lab 02/15/13 0359  PROBNP 1168.0*     CHEMISTRY  Recent Labs Lab 02/17/13 1510 02/18/13 0422 02/19/13 0400 02/20/13 0414 02/21/13 0330  NA 133* 133* 131* 134* 134*  K 3.8 4.0 3.7 3.5 4.2  CL 98  100 99 102 102  CO2 25 24 22 21 22   GLUCOSE 245* 150* 215* 168* 137*  BUN 23 26* 35* 42* 47*  CREATININE 1.36* 1.26* 1.06 1.06 1.19*  CALCIUM 8.3* 8.2* 8.3* 8.1* 8.1*   Estimated Creatinine Clearance: 31.2 ml/min (by C-G formula based on Cr of 1.19).   LIVER  Recent Labs Lab 02/12/2013 1950  AST 20  ALT 7  ALKPHOS 71  BILITOT 0.3  PROT 6.5  ALBUMIN 2.2*     INFECTIOUS  Recent Labs Lab 02/12/2013 1950 03/01/2013 2016 02/15/13 0359 02/16/13 0349 02/17/13 0929 02/18/13 0422  LATICACIDVEN  --  2.27* 1.0  --  1.3  --   PROCALCITON 0.15  --   --  1.41  --  3.08     ENDOCRINE CBG (last 3)    Recent Labs  02/20/13 1655 02/20/13 2046 02/21/13 0758  GLUCAP 154* 120* 122*         IMAGING x48h  No results found.    ASSESSMENT / PLAN:  PULMONARY A:  Acute hypoxemic respiratory failure due to pneumonia, ARDS Viral PNA +/- bacterial super-infection (NOS) Passed SBT and extubated 12/19  02/21/13 - doing well on nasal cannula  P:   -pulm hygiene  -wean FIO2 -see ID section -diuresis as bun/cr and BP allow  -f/u CXR  CARDIOVASCULAR A:  Septic shock, weaning off neo NSTEMI >demand ischemia  freq PACs  02/21/13 - off pressors x 24h P:  -tele -asa, heparin -f/u cardiology as out-pt for stress test  -rapid taper steroids ; aim to dc 02/22/13  RENAL A:   AKI > improving P:   -KVO IVF -lasix trial 12/19 - recheck bnp and creat 02/22/13  GASTROINTESTINAL A:   No acute issues P:   -Pantoprazole for stress ulcer prophylaxis -adv diet as tol  HEMATOLOGIC A:  No acute issues P:  -monitor cbc - PRBC for hgb </= 6.9gm%    - exceptions are   -  if ACS susepcted/confirmed then transfuse for hgb </= 8.0gm%,  or    - active bleeding with hemodynamic instability, then transfuse regardless of hemoglobin value   At at all times try to transfuse 1 unit prbc as possible with exception of active hemorrhage    INFECTIOUS A:   Viral PNA (+ rhinovirus) +/- bacterial super-infection (NOS)  P:   -d/c vanc, narrow to resp quinolone; will set stop date 02/22/13 -f/u repeat cultures  ENDOCRINE A:   No acute issues P:   -monitor glucose -taper steroids  NEUROLOGIC A:  Deconditioning s/p critical illness  P:   PT/OT consults placed   TODAY'S SUMMARY:  Needs aggressive PT. Niece updated   Dr. Kalman Shan, M.D., [facility].C.P Pulmonary and Critical Care Medicine Staff Physician [facility] [facility] Pager: [phone], If no answer or between  15:00h - 7:00h: call [phone]  02/21/2013 10:25  AM

## 2013-02-22 ENCOUNTER — Inpatient Hospital Stay (HOSPITAL_COMMUNITY): Payer: Medicare Other

## 2013-02-22 LAB — GLUCOSE, CAPILLARY
Glucose-Capillary: 147 mg/dL — ABNORMAL HIGH (ref 70–99)
Glucose-Capillary: 154 mg/dL — ABNORMAL HIGH (ref 70–99)

## 2013-02-22 LAB — BASIC METABOLIC PANEL
BUN: 53 mg/dL — ABNORMAL HIGH (ref 6–23)
Creatinine, Ser: 1.21 mg/dL — ABNORMAL HIGH (ref 0.50–1.10)
GFR calc Af Amer: 45 mL/min — ABNORMAL LOW (ref >90)
GFR calc non Af Amer: 39 mL/min — ABNORMAL LOW (ref >90)
Glucose, Bld: 102 mg/dL — ABNORMAL HIGH (ref 70–99)
Potassium: 3.7 mEq/L (ref 3.5–5.1)

## 2013-02-22 LAB — CBC WITH DIFFERENTIAL/PLATELET
Basophils Absolute: 0 10*3/uL (ref 0.0–0.1)
Basophils Relative: 0 % (ref 0–1)
Eosinophils Absolute: 0.4 10*3/uL (ref 0.0–0.7)
HCT: 33.4 % — ABNORMAL LOW (ref 36.0–46.0)
Hemoglobin: 11.4 g/dL — ABNORMAL LOW (ref 12.0–15.0)
MCH: 31.9 pg (ref 26.0–34.0)
MCHC: 34.1 g/dL (ref 30.0–36.0)
Monocytes Absolute: 2.6 10*3/uL — ABNORMAL HIGH (ref 0.1–1.0)
Monocytes Relative: 12 % (ref 3–12)
Neutrophils Relative %: 76 % (ref 43–77)
Platelets: 302 10*3/uL (ref 150–400)
RDW: 14.4 % (ref 11.5–15.5)
WBC: 21.4 10*3/uL — ABNORMAL HIGH (ref 4.0–10.5)

## 2013-02-22 LAB — TROPONIN I: Troponin I: 0.34 ng/mL (ref <0.30)

## 2013-02-22 LAB — CLOSTRIDIUM DIFFICILE BY PCR: Toxigenic C. Difficile by PCR: NEGATIVE

## 2013-02-22 LAB — MAGNESIUM: Magnesium: 2.3 mg/dL (ref 1.5–2.5)

## 2013-02-22 MED ORDER — PNEUMOCOCCAL VAC POLYVALENT 25 MCG/0.5ML IJ INJ
0.5000 mL | INJECTION | INTRAMUSCULAR | Status: AC
  Administered 2013-02-23: 10:00:00 0.5 mL via INTRAMUSCULAR
  Filled 2013-02-22 (×2): qty 0.5

## 2013-02-22 MED ORDER — HEPARIN SODIUM (PORCINE) 5000 UNIT/ML IJ SOLN
5000.0000 [IU] | Freq: Three times a day (TID) | INTRAMUSCULAR | Status: DC
  Administered 2013-02-22 – 2013-02-28 (×18): 5000 [IU] via SUBCUTANEOUS
  Filled 2013-02-22 (×21): qty 1

## 2013-02-22 MED ORDER — BISOPROLOL FUMARATE 5 MG PO TABS
2.5000 mg | ORAL_TABLET | Freq: Two times a day (BID) | ORAL | Status: DC
  Administered 2013-02-22 – 2013-02-23 (×3): 2.5 mg via ORAL
  Filled 2013-02-22 (×4): qty 0.5

## 2013-02-22 MED ORDER — PANTOPRAZOLE SODIUM 40 MG PO TBEC
40.0000 mg | DELAYED_RELEASE_TABLET | Freq: Two times a day (BID) | ORAL | Status: DC
  Administered 2013-02-22 – 2013-02-26 (×9): 40 mg via ORAL
  Filled 2013-02-22 (×15): qty 1

## 2013-02-22 MED ORDER — INFLUENZA VAC SPLIT QUAD 0.5 ML IM SUSP
0.5000 mL | INTRAMUSCULAR | Status: AC
  Administered 2013-02-23: 0.5 mL via INTRAMUSCULAR
  Filled 2013-02-22 (×2): qty 0.5

## 2013-02-22 MED ORDER — FUROSEMIDE 10 MG/ML IJ SOLN
80.0000 mg | Freq: Once | INTRAMUSCULAR | Status: AC
  Administered 2013-02-22: 15:00:00 80 mg via INTRAVENOUS
  Filled 2013-02-22: qty 8

## 2013-02-22 MED ORDER — FUROSEMIDE 10 MG/ML IJ SOLN
40.0000 mg | Freq: Once | INTRAMUSCULAR | Status: AC
  Administered 2013-02-22: 06:00:00 40 mg via INTRAVENOUS
  Filled 2013-02-22: qty 4

## 2013-02-22 MED ORDER — MORPHINE SULFATE 2 MG/ML IJ SOLN
1.0000 mg | Freq: Once | INTRAMUSCULAR | Status: AC
  Administered 2013-02-22: 1 mg via INTRAVENOUS
  Filled 2013-02-22: qty 1

## 2013-02-22 NOTE — Progress Notes (Signed)
At 0500 this morning, patient woke up complaining of SOB. Patient alert and oriented times 4. Patient coughing up thick white sputum. HR was 130, Respirations 40. Respiratory called to come up and do a treatment. After treatment, patient did not seem to be any better. On call critical care doctor was notified and new orders were given for a stat chest xray. Patient watched closely during this time. Patient's HR stayed in the 120's and respirations in the 30's. Oxygen stat stayed at 95% on 4L [place]. Patient's respirations still shallow. On call doctor called RN back after chest xray results and gave new orders for EKG, labs, morphine, and lasix. RN gave them to patient and is monitoring patient closely. Will report off to day RN.

## 2013-02-22 NOTE — Progress Notes (Signed)
Name: Alicia Yoder MRN: [id number] DOB: 01-31-1923    ADMISSION DATE:  02/08/2013 CONSULTATION DATE:  02/17/2013   REFERRING MD :  Linnell Fulling PRIMARY SERVICE: TRH  CHIEF COMPLAINT:  Acute respiratory failure  BRIEF PATIENT DESCRIPTION: 79 y/o female with hypertension admitted on 12/13 with acute hypoxemic respiratory failure after a viral illness. PCCM consulted on 12/16 for worsening respiratory distress, renal failure and hypotension.  LINES / TUBES: 12/16 ETT >>12/19 12/16 L IJ CVL >>   CULTURES: 12/14 urine >> negative 12/13 blood >> negative  12/13 Viral PCR (nasal) negative >>Rhinivirus + 12/16 blood >> 12/16 BAL >>normal flora 12/16 Viral culture (bal) >>  ANTIBIOTICS: 12/13 levaquin >> 12/15 12/16 vanc >>12/19 12/16 cefepime >>12/19 12/16 Tamiflu >>12/18 12/16 azithro >>>12/19 12/19 levaquin> 12/21   SIGNIFICANT EVENTS / STUDIES:  12/13 admission 12/16 PCCM consult 12/16 Bronchoscopy > thin secretions LUL, BAL anteriomedial basal segment LLL 12/19 - extubated. Off pressors 12/20 tx to floor    SUBJECTIVE/OVERNIGHT/INTERVAL HX More sob, got lasix and morphine> better and sitting in chair comfortable on nasal 02     VITAL SIGNS: Temp:  [97.8 F (36.6 C)-98.4 F (36.9 C)] 98.2 F (36.8 C) (12/21 0505) Pulse Rate:  [92-126] 126 (12/21 0654) Resp:  [22-40] 36 (12/21 0654) BP: (103-128)/(38-67) 120/52 mmHg (12/21 0505) SpO2:  [93 %-100 %] 95 % (12/21 0654) Weight:  [161 lb 1.6 oz (73.074 kg)] 161 lb 1.6 oz (73.074 kg) (12/20 2240) 02 rx  4lpm      INTAKE / OUTPUT: Intake/Output     12/20 0701 - 12/21 0700 12/21 0701 - 12/22 0700   P.O. 660 360   I.V. (mL/kg) 240 (3.3)    NG/GT     IV Piggyback     Total Intake(mL/kg) 900 (12.3) 360 (4.9)   Urine (mL/kg/hr) 395 (0.2) 350 (0.9)   Total Output 395 350   Net +505 +10        Stool Occurrence 3 x      Intake/Output Summary (Last 24 hours) at 02/22/13 1246 Last data filed at 02/22/13  0840  Gross per 24 hour  Intake    920 ml  Output    650 ml  Net    270 ml    PHYSICAL EXAMINATION:  General: comfortable   Neuro:  Awake and alert. Oriented   HEENT:  NCAT, PERRL Cardiovascular:  RRR, no murmur, positive gallop, no JVD  Lungs:  Crackles in bases bilaterally Abdomen:  BS+, soft, nontender Musculoskeletal:  Normal bulk and tone Skin:  No rash or breakdown Ext: warm, no cyanosis or clubbing; trace pretibial edema  LABS:      Recent Labs Lab 02/17/13 0727 02/17/13 1230 02/17/13 1409 02/18/13 0430 02/19/13 0435  PHART 7.479* 7.276* 7.399 7.389 7.386  PCO2ART 35.2 60.1* 39.1 37.5 36.5  PO2ART 48.7* 359.0* 69.2* 82.8 85.7  HCO3 25.8* 27.0* 23.7 22.5 21.4  TCO2 23.1 24.6 21.3 20.6 19.8  O2SAT 84.8 99.3 92.7 96.3 96.4    CBC  Recent Labs Lab 02/20/13 0414 02/21/13 0330 02/22/13 0657  HGB 11.4* 10.9* 11.4*  HCT 34.2* 32.3* 33.4*  WBC 20.3* 17.8* 21.4*  PLT 380 319 302    COAGULATION No results found for this basename: INR,  in the last 168 hours  CARDIAC    Recent Labs Lab 02/17/13 0929 02/17/13 1510 02/17/13 2228 02/18/13 0241 02/22/13 0547  TROPONINI <0.30 1.76* 1.12* 0.68* 0.34*    Recent Labs Lab 02/22/13 0658  PROBNP 3349.0*  CHEMISTRY  Recent Labs Lab 02/18/13 0422 02/19/13 0400 02/20/13 0414 02/21/13 0330 02/22/13 0657  NA 133* 131* 134* 134* 136  K 4.0 3.7 3.5 4.2 3.7  CL 100 99 102 102 105  CO2 24 22 21 22 22   GLUCOSE 150* 215* 168* 137* 102*  BUN 26* 35* 42* 47* 53*  CREATININE 1.26* 1.06 1.06 1.19* 1.21*  CALCIUM 8.2* 8.3* 8.1* 8.1* 8.2*  MG  --   --   --   --  2.3  PHOS  --   --   --   --  2.6   Estimated Creatinine Clearance: 32.9 ml/min (by C-G formula based on Cr of 1.21).   LIVER No results found for this basename: AST, ALT, ALKPHOS, BILITOT, PROT, ALBUMIN, INR,  in the last 168 hours   INFECTIOUS  Recent Labs Lab 02/16/13 0349 02/17/13 0929 02/18/13 0422  LATICACIDVEN  --  1.3  --    PROCALCITON 1.41  --  3.08     ENDOCRINE CBG (last 3)   Recent Labs  02/21/13 2109 02/22/13 0747 02/22/13 1202  GLUCAP 105* 101* 147*         IMAGING x48h  Dg Chest Port 1 View  02/22/2013   CLINICAL DATA:  Respiratory distress  EXAM: PORTABLE CHEST - 1 VIEW  COMPARISON:  02/19/2013  FINDINGS: Interval tracheal and esophageal extubation. Left IJ catheter in stable position.  No significant change in diffuse airspace disease, likely with small effusions. No pneumothorax. Stable heart size. Thoracic levoscoliosis.  IMPRESSION: Unchanged diffuse lung disease compared to 2 days prior, likely a combination of edema and pneumonia.   Electronically Signed   By: Tiburcio Pea M.D.   On: 02/22/2013 06:17      ASSESSMENT / PLAN:  PULMONARY A:  Acute hypoxemic respiratory failure due to pneumonia, ARDS Viral PNA +/- bacterial super-infection (NOS) Passed SBT and extubated 12/19  12/20 pm resp distress but no change on cxr   P:      -wean FIO2 -see ID section -diuresis as bun/cr and BP allow     CARDIOVASCULAR A:  Septic shock, weaning off neo NSTEMI >demand ischemia  freq PACs 02/21/13 - off pressors x 24h 12/21 restarted Beta blockers P:  -tele -asa sq hep -f/u cardiology as out-pt for stress test  -rapid taper steroids   - restart BB - Strongly prefer in this setting: Bystolic, the most beta -1  selective Beta blocker available in sample form, with bisoprolol the most selective generic choice  on the market.   RENAL A:   AKI > improving Lab Results  Component Value Date   CREATININE 1.21* 02/22/2013   CREATININE 1.19* 02/21/2013   CREATININE 1.06 02/20/2013    P:   -KVO IVF      GASTROINTESTINAL A:   No acute issues P:    -adv diet as tol  HEMATOLOGIC  Recent Labs Lab 02/20/13 0414 02/21/13 0330 02/22/13 0657  HGB 11.4* 10.9* 11.4*    A:  No acute issues P:  -monitor cbc - PRBC for hgb </= 6.9gm%    - exceptions are   -  if ACS  suspected/confirmed then transfuse for hgb </= 8.0gm%,  or    - active bleeding with hemodynamic instability, then transfuse regardless of hemoglobin value   At at all times try to transfuse 1 unit prbc as possible with exception of active hemorrhage    INFECTIOUS A:   Viral PNA (+ rhinovirus) +/- bacterial super-infection (NOS)  P:  Per dashboard  ENDOCRINE A:   No acute issues P:   -monitor glucose -taper steroids  NEUROLOGIC A:  Deconditioning s/p critical illness  P:   PT/OT consults placed     Summary: resp distress with bnp up and bp up so add back low dose BB, diuresis and continue to monitor on floor     Sandrea Hughs, MD Pulmonary and Critical Care Medicine [facility] Cell [phone] After 5:30 PM or weekends, call [phone]

## 2013-02-22 NOTE — Progress Notes (Signed)
Removed patients foley catheter per protocol. Patient expressed to RN she is able to let nursing staff know when she needs to void. RN encouraged patient to get OOB for breakfast, and patient agreed. RN assisted patient to chair, patient tolerated transfer will and is currently eating breakfastl. Will continue to monitor.

## 2013-02-22 NOTE — Plan of Care (Signed)
Problem: Phase II Progression Outcomes Goal: Wean O2 if indicated Outcome: Progressing Weaned oxygen from 4L to 2L nasal canual saturation at 97%.

## 2013-02-23 LAB — VIRAL CULTURE VIRC: Special Requests: NORMAL

## 2013-02-23 LAB — CBC WITH DIFFERENTIAL/PLATELET
Basophils Absolute: 0 10*3/uL (ref 0.0–0.1)
Basophils Relative: 0 % (ref 0–1)
Eosinophils Absolute: 0.5 10*3/uL (ref 0.0–0.7)
Hemoglobin: 10.8 g/dL — ABNORMAL LOW (ref 12.0–15.0)
Lymphocytes Relative: 13 % (ref 12–46)
MCH: 32 pg (ref 26.0–34.0)
MCHC: 34.2 g/dL (ref 30.0–36.0)
Monocytes Absolute: 1.9 10*3/uL — ABNORMAL HIGH (ref 0.1–1.0)
Monocytes Relative: 11 % (ref 3–12)
Neutrophils Relative %: 73 % (ref 43–77)
Platelets: 306 10*3/uL (ref 150–400)
RDW: 14.5 % (ref 11.5–15.5)
WBC: 17.5 10*3/uL — ABNORMAL HIGH (ref 4.0–10.5)

## 2013-02-23 LAB — CULTURE, BLOOD (ROUTINE X 2)

## 2013-02-23 LAB — BASIC METABOLIC PANEL
Calcium: 8 mg/dL — ABNORMAL LOW (ref 8.4–10.5)
Chloride: 101 mEq/L (ref 96–112)
Creatinine, Ser: 1.37 mg/dL — ABNORMAL HIGH (ref 0.50–1.10)
GFR calc Af Amer: 39 mL/min — ABNORMAL LOW (ref >90)
GFR calc non Af Amer: 33 mL/min — ABNORMAL LOW (ref >90)

## 2013-02-23 LAB — PHOSPHORUS: Phosphorus: 3.5 mg/dL (ref 2.3–4.6)

## 2013-02-23 LAB — GLUCOSE, CAPILLARY

## 2013-02-23 MED ORDER — ATENOLOL 50 MG PO TABS
50.0000 mg | ORAL_TABLET | Freq: Two times a day (BID) | ORAL | Status: DC
  Administered 2013-02-24 – 2013-02-26 (×5): 50 mg via ORAL
  Filled 2013-02-23 (×10): qty 1

## 2013-02-23 MED ORDER — ATENOLOL 50 MG PO TABS
50.0000 mg | ORAL_TABLET | ORAL | Status: AC
  Administered 2013-02-23: 50 mg via ORAL
  Filled 2013-02-23: qty 1

## 2013-02-23 MED ORDER — METOPROLOL TARTRATE 1 MG/ML IV SOLN: 2.5000 mg | INTRAVENOUS | Status: DC | PRN

## 2013-02-23 MED ORDER — ACETAMINOPHEN 325 MG PO TABS
650.0000 mg | ORAL_TABLET | Freq: Four times a day (QID) | ORAL | Status: DC | PRN
  Administered 2013-02-23 – 2013-02-24 (×3): 650 mg via ORAL
  Filled 2013-02-23 (×3): qty 2

## 2013-02-23 MED ORDER — TRAZODONE HCL 100 MG PO TABS
100.0000 mg | ORAL_TABLET | Freq: Every evening | ORAL | Status: DC | PRN
  Administered 2013-02-23: 22:00:00 100 mg via ORAL
  Filled 2013-02-23: qty 1

## 2013-02-23 NOTE — Significant Event (Signed)
C/o pain in legs.  Ordered tylenol.  Coralyn Helling, MD 02/23/2013, 6:16 AM

## 2013-02-23 NOTE — Progress Notes (Signed)
Physical Therapy Treatment Patient Details Name: Alicia Yoder MRN: [id number] DOB: Feb 28, 1923 Today's Date: 02/23/2013 Time: 4098-1191 PT Time Calculation (min): 24 min  PT Assessment / Plan / Recommendation  History of Present Illness 79 y/o female with hypertension admitted on 12/13 with acute hypoxemic respiratory failure after a viral illness. PCCM consulted on 12/16 for worsening respiratory distress, renal failure and hypotension. Extubated 12/19.   PT Comments   Progressing very slowly with mobility. Mod encouragement for pt to participate. Pt is very deconditioned. Still unable to attempt ambulation due to weakness, fatigue. Discussed d/c plan and family states they will take pt home.   Follow Up Recommendations  Home health PT;Supervision for mobility/OOB (Family/pt decline SNF-will take pt home)     Does the patient have the potential to tolerate intense rehabilitation     Barriers to Discharge        Equipment Recommendations  Rolling walker with 5" wheels    Recommendations for Other Services OT consult  Frequency Min 3X/week   Progress towards PT Goals Progress towards PT goals: Progressing toward goals (very slowly)  Plan Current plan remains appropriate    Precautions / Restrictions Precautions Precautions: Fall Restrictions Weight Bearing Restrictions: No   Pertinent Vitals/Pain Pt denied pain.  Start of session: 90% 2L, 122 bpm at rest Increased O2 to 3L during standing attempts.  End of session: 92% 2L, 115 bpm    Mobility  Bed Mobility Bed Mobility: Not assessed Details for Bed Mobility Assistance: Pt sitting in recliner.  Transfers Transfers: Sit to Stand;Stand to Sit Sit to Stand: 2: Max assist;From chair/3-in-1;With armrests Stand to Sit: 2: Max assist;To chair/3-in-1;With armrests Details for Transfer Assistance: x 2. Pt only able to stand for 15-20 second intervals. Fatigues very easily. Dyspnea 3/4 with activity. Mulitimodal cues and assist to  rise, stabilize, control descent Ambulation/Gait Ambulation/Gait Assistance Details: Pt unable to attempt this session    Exercises General Exercises - Lower Extremity Ankle Circles/Pumps: AROM;Both;10 reps;Seated Long Arc Quad: AROM;Both;10 reps;Seated Hip Flexion/Marching: AROM;Both;10 reps;Seated   PT Diagnosis:    PT Problem List:   PT Treatment Interventions:     PT Goals (current goals can now be found in the care plan section)    Visit Information  Last PT Received On: 02/23/13 Assistance Needed: +2 History of Present Illness: 79 y/o female with hypertension admitted on 12/13 with acute hypoxemic respiratory failure after a viral illness. PCCM consulted on 12/16 for worsening respiratory distress, renal failure and hypotension. Extubated 12/19.    Subjective Data      Cognition  Cognition Arousal/Alertness: Lethargic Behavior During Therapy: WFL for tasks assessed/performed Overall Cognitive Status: Within Functional Limits for tasks assessed    Balance  Balance Balance Assessed: Yes Static Standing Balance Static Standing - Balance Support: Bilateral upper extremity supported Static Standing - Level of Assistance: 3: Mod assist  End of Session PT - End of Session Equipment Utilized During Treatment: Gait belt Activity Tolerance: Patient limited by fatigue Patient left: in chair;with call bell/phone within reach;with family/visitor present   GP     Rebeca Alert, MPT Pager: [phone]

## 2013-02-23 NOTE — Progress Notes (Signed)
Name: Alicia Yoder MRN: [id number] DOB: 1923/01/06    ADMISSION DATE:  2013/03/07 CONSULTATION DATE:  02/17/2013   REFERRING MD :  Linnell Fulling PRIMARY SERVICE: [facility]  CHIEF COMPLAINT:  Acute respiratory failure  BRIEF PATIENT DESCRIPTION: 79 y/o female with hypertension admitted on 12/13 with acute hypoxemic respiratory failure after a viral illness. PCCM consulted on 12/16 for worsening respiratory distress, renal failure and hypotension.  LINES / TUBES: 12/16 ETT >>12/19 12/16 L IJ CVL >>   CULTURES: 12/14 urine >> negative 12/13 blood >> negative  12/13 Viral PCR (nasal) negative >>Rhinivirus + 12/16 blood >>neg 12/16 BAL >>normal flora 12/16 Viral culture (bal) >>  ANTIBIOTICS: 12/13 levaquin >> 12/15 12/16 vanc >>12/19 12/16 cefepime >>12/19 12/16 Tamiflu >>12/18 12/16 azithro >>>12/19 12/19 levaquin> 12/21   SIGNIFICANT EVENTS / STUDIES:  12/13 admission 12/16 PCCM consult 12/16 Bronchoscopy > thin secretions LUL, BAL anteriomedial basal segment LLL 12/19 - extubated. Off pressors 12/20 tx to floor 12-22 triad to pick up 12-23    SUBJECTIVE/OVERNIGHT/INTERVAL HX Nad. Feels weak and wants solid food.     VITAL SIGNS: Temp:  [97.3 F (36.3 C)-98.4 F (36.9 C)] 98.4 F (36.9 C) (12/22 0520) Pulse Rate:  [110-137] 115 (12/22 0520) Resp:  [22-26] 22 (12/22 0520) BP: (99-131)/(52-59) 99/56 mmHg (12/22 0520) SpO2:  [92 %-97 %] 92 % (12/22 0520) Weight:  [161 lb 1.6 oz (73.074 kg)] 161 lb 1.6 oz (73.074 kg) (12/22 0520)       INTAKE / OUTPUT: Intake/Output     12/21 0701 - 12/22 0700 12/22 0701 - 12/23 0700   P.O. 840 120   I.V. (mL/kg)     IV Piggyback 150    Total Intake(mL/kg) 990 (13.5) 120 (1.6)   Urine (mL/kg/hr) 1150 (0.7)    Total Output 1150     Net -160 +120        Urine Occurrence 2 x 1 x   Stool Occurrence 1 x 1 x     Intake/Output Summary (Last 24 hours) at 02/23/13 1124 Last data filed at 02/23/13 0900  Gross per 24 hour   Intake    750 ml  Output    800 ml  Net    -50 ml    PHYSICAL EXAMINATION:  General: comfortable   Neuro:  Awake and alert. Oriented   HEENT:  NCAT, PERRL Cardiovascular:  RRR, no murmur, positive gallop, no JVD  Lungs:  Crackles in bases bilaterally Abdomen:  BS+, soft, nontender Musculoskeletal:  Normal bulk and tone Skin:  No rash or breakdown Ext: warm, no cyanosis or clubbing; trace pretibial edema  LABS:      Recent Labs Lab 02/17/13 0727 02/17/13 1230 02/17/13 1409 02/18/13 0430 02/19/13 0435  PHART 7.479* 7.276* 7.399 7.389 7.386  PCO2ART 35.2 60.1* 39.1 37.5 36.5  PO2ART 48.7* 359.0* 69.2* 82.8 85.7  HCO3 25.8* 27.0* 23.7 22.5 21.4  TCO2 23.1 24.6 21.3 20.6 19.8  O2SAT 84.8 99.3 92.7 96.3 96.4    CBC  Recent Labs Lab 02/21/13 0330 02/22/13 0657 02/23/13 0409  HGB 10.9* 11.4* 10.8*  HCT 32.3* 33.4* 31.6*  WBC 17.8* 21.4* 17.5*  PLT 319 302 306    COAGULATION No results found for this basename: INR,  in the last 168 hours  CARDIAC    Recent Labs Lab 02/17/13 2228 02/18/13 0241 02/22/13 0547 02/22/13 1245 02/22/13 1809  TROPONINI 1.12* 0.68* 0.34* 0.34* <0.30    Recent Labs Lab 02/22/13 0658 02/23/13 0409  PROBNP 3349.0* 3066.0*  CHEMISTRY  Recent Labs Lab 02/19/13 0400 02/20/13 0414 02/21/13 0330 02/22/13 0657 02/23/13 0409  NA 131* 134* 134* 136 134*  K 3.7 3.5 4.2 3.7 3.7  CL 99 102 102 105 101  CO2 22 21 22 22 22   GLUCOSE 215* 168* 137* 102* 98  BUN 35* 42* 47* 53* 53*  CREATININE 1.06 1.06 1.19* 1.21* 1.37*  CALCIUM 8.3* 8.1* 8.1* 8.2* 8.0*  MG  --   --   --  2.3 2.3  PHOS  --   --   --  2.6 3.5   Estimated Creatinine Clearance: 29 ml/min (by C-G formula based on Cr of 1.37).   LIVER No results found for this basename: AST, ALT, ALKPHOS, BILITOT, PROT, ALBUMIN, INR,  in the last 168 hours   INFECTIOUS  Recent Labs Lab 02/17/13 0929 02/18/13 0422  LATICACIDVEN 1.3  --   PROCALCITON  --  3.08      ENDOCRINE CBG (last 3)   Recent Labs  02/22/13 1617 02/22/13 2202 02/23/13 0756  GLUCAP 154* 149* 118*         IMAGING x48h  Dg Chest Port 1 View  02/22/2013   CLINICAL DATA:  Respiratory distress  EXAM: PORTABLE CHEST - 1 VIEW  COMPARISON:  02/19/2013  FINDINGS: Interval tracheal and esophageal extubation. Left IJ catheter in stable position.  No significant change in diffuse airspace disease, likely with small effusions. No pneumothorax. Stable heart size. Thoracic levoscoliosis.  IMPRESSION: Unchanged diffuse lung disease compared to 2 days prior, likely a combination of edema and pneumonia.   Electronically Signed   By: Tiburcio Pea M.D.   On: 02/22/2013 06:17      ASSESSMENT / PLAN:  PULMONARY A:  Acute hypoxemic respiratory failure due to pneumonia, ARDS Viral PNA +/- bacterial super-infection (NOS) Passed SBT and extubated 12/19   P:      -wean FIO2 -see ID section     CARDIOVASCULAR A:  Septic shock, weaning off neo NSTEMI >demand ischemia  freq PACs 02/21/13 - off pressors x 24h 12/21 restarted Beta blockers P:  -tele -asa sq hep -f/u cardiology as out-pt for stress test  -rapid taper steroids  Dc on 12-22 - restart BB - Strongly prefer in this setting: Bystolic,  RENAL A:   AKI > improving Lab Results  Component Value Date   CREATININE 1.37* 02/23/2013   CREATININE 1.21* 02/22/2013   CREATININE 1.19* 02/21/2013    P:   -KVO IVF      GASTROINTESTINAL A:   No acute issues P:    -adv diet as tol  HEMATOLOGIC  Recent Labs Lab 02/21/13 0330 02/22/13 0657 02/23/13 0409  HGB 10.9* 11.4* 10.8*    A:  No acute issues P:  -monitor cbc - PRBC for hgb </= 6.9gm%    - exceptions are   -  if ACS suspected/confirmed then transfuse for hgb </= 8.0gm%,  or    - active bleeding with hemodynamic instability, then transfuse regardless of hemoglobin value   At at all times try to transfuse 1 unit prbc as possible with exception  of active hemorrhage    INFECTIOUS A:   Viral PNA (+ rhinovirus) +/- bacterial super-infection (NOS)  P:   Per dashboard  ENDOCRINE A:   No acute issues P:   -monitor glucose -taper steroids  NEUROLOGIC A:  Deconditioning s/p critical illness  P:   PT/OT consults placed     Summary: Improved 12-22. Advance diet, triad to pick up 12-23  Brett Canales Minor ACNP Adolph Pollack PCCM Pager [phone] till 3 pm If no answer page [phone] 02/23/2013, 11:29 AM  Will keep in the hospital for now, transfer to [facility], PCCM will sign off, please call back if needed.  Patient seen and examined, agree with above note.  I dictated the care and orders written for this patient under my direction.  Alyson Reedy, MD [phone]

## 2013-02-23 NOTE — Progress Notes (Signed)
Patient's heart rate is tachy between 140-149 beats per minute.  Heart rate is non sustaining between 140-149, but is remaining tachy.  Vital signs are stable.  Patient is laying in the bed and is asymptomatic.  EKG shows supraventricular tachycardia with frequent premature ventricular complexes at a rate of 147 bpm. Dr. Darrol Angel notified.  New orders received.  Will continue to monitor patient.

## 2013-02-24 ENCOUNTER — Inpatient Hospital Stay (HOSPITAL_COMMUNITY): Payer: Medicare Other

## 2013-02-24 LAB — BASIC METABOLIC PANEL
BUN: 51 mg/dL — ABNORMAL HIGH (ref 6–23)
CO2: 21 mEq/L (ref 19–32)
Calcium: 8 mg/dL — ABNORMAL LOW (ref 8.4–10.5)
Creatinine, Ser: 1.39 mg/dL — ABNORMAL HIGH (ref 0.50–1.10)
Glucose, Bld: 83 mg/dL (ref 70–99)

## 2013-02-24 LAB — GLUCOSE, CAPILLARY
Glucose-Capillary: 87 mg/dL (ref 70–99)
Glucose-Capillary: 111 mg/dL — ABNORMAL HIGH (ref 70–99)
Glucose-Capillary: 171 mg/dL — ABNORMAL HIGH (ref 70–99)
Glucose-Capillary: 115 mg/dL — ABNORMAL HIGH (ref 70–99)

## 2013-02-24 MED ORDER — MORPHINE SULFATE 2 MG/ML IJ SOLN
1.0000 mg | Freq: Once | INTRAMUSCULAR | Status: AC
  Administered 2013-02-24: 03:00:00 1 mg via INTRAVENOUS
  Filled 2013-02-24: qty 1

## 2013-02-24 MED ORDER — FUROSEMIDE 10 MG/ML IJ SOLN
40.0000 mg | Freq: Once | INTRAMUSCULAR | Status: AC
  Administered 2013-02-24: 40 mg via INTRAVENOUS
  Filled 2013-02-24: qty 4

## 2013-02-24 MED ORDER — FUROSEMIDE 10 MG/ML IJ SOLN
40.0000 mg | Freq: Two times a day (BID) | INTRAMUSCULAR | Status: DC
  Administered 2013-02-24 – 2013-02-27 (×7): 40 mg via INTRAVENOUS
  Filled 2013-02-24 (×11): qty 4

## 2013-02-24 MED ORDER — ENSURE COMPLETE PO LIQD
237.0000 mL | Freq: Two times a day (BID) | ORAL | Status: DC
  Administered 2013-02-24 – 2013-02-26 (×3): 237 mL via ORAL

## 2013-02-24 NOTE — Progress Notes (Signed)
eLink Physician-Brief Progress Note Patient Name: Alicia Yoder DOB: 06-Sep-1922 MRN: [id number]  Date of Service  02/24/2013   HPI/Events of Note  Patient with resp distress, tachypnea with RR in the 30s with increased WOB, and BP of 98/44.  Had similar episode a few days back and received lasxi/morphine.  Fluids have been KVOed on the 22nd.  PCXR shows diffuse infiltrate with suggestion of some edema superimposed.   eICU Interventions  Plan: Lasix 40 mg IV once Morphine 1 mg IV once Monitor response to intervention - close eye on BP   Intervention Category Intermediate Interventions: Respiratory distress - evaluation and management  Gerritt Galentine 02/24/2013, 2:52 AM

## 2013-02-24 NOTE — Progress Notes (Signed)
[facility] PROGRESS NOTE  Alicia Yoder RUE:[id number] DOB: 06/22/1922 DOA: 2013/03/08 PCP: Lolita Patella, MD  Assessment/Plan: Acute Hypoxemic Respiratory Failure, Ventilatory Dependant  -secondary to viral PNA +/- superimposed bacterial infection; ARDS. -Extubated 12/19. -Had some respiratory distress overnight on 12/23; CXR still with ASD and superimposed edema. -Will start on scheduled lasix as she is 7 L positive since admission and has coarse crackles on lung exam.  Septic Shock -Required pressors. -Off of stress-dose steroids.  NSTEMI -Secondary to demand ischemia from sepsis with shock. -ASA, BB. -Will need stress as an OP once acute issues resolve.  ARF -Has had some worsening in the past few days, suspect related to diuresis for pulmonary edema. -Continue to closely follow.  Deconditioning -Secondary to critical illness. -PT/OT evals once respiratory status more stable.  Code Status: Full Code Family Communication: Patient only  Disposition Plan: To be determined   Consultants:  None   Antibiotics:  None   Subjective: Feels a little SOB today.   Objective: Filed Vitals:   02/24/13 0403 02/24/13 0429 02/24/13 0515 02/24/13 0608  BP:   109/47   Pulse:   91   Temp:   98.2 F (36.8 C)   TempSrc:   Oral   Resp: 28 28 28 24   Height:      Weight:   71.396 kg (157 lb 6.4 oz)   SpO2:   93%     Intake/Output Summary (Last 24 hours) at 02/24/13 0819 Last data filed at 02/24/13 0515  Gross per 24 hour  Intake    480 ml  Output   1200 ml  Net   -720 ml   Filed Weights   02/21/13 2240 02/23/13 0520 02/24/13 0515  Weight: 73.074 kg (161 lb 1.6 oz) 73.074 kg (161 lb 1.6 oz) 71.396 kg (157 lb 6.4 oz)    Exam:   General:  AA Ox3  Cardiovascular: RRR  Respiratory: Coarse bilateral crackles  Abdomen: S/NT/ND/+BS  Extremities: 3++ edema bilaterally   Neurologic:  Non-focal, generalized weakness  Data Reviewed: Basic  Metabolic Panel:  Recent Labs Lab 02/20/13 0414 02/21/13 0330 02/22/13 0657 02/23/13 0409 02/24/13 0610  NA 134* 134* 136 134* 138  K 3.5 4.2 3.7 3.7 3.5  CL 102 102 105 101 104  CO2 21 22 22 22 21   GLUCOSE 168* 137* 102* 98 83  BUN 42* 47* 53* 53* 51*  CREATININE 1.06 1.19* 1.21* 1.37* 1.39*  CALCIUM 8.1* 8.1* 8.2* 8.0* 8.0*  MG  --   --  2.3 2.3  --   PHOS  --   --  2.6 3.5  --    Liver Function Tests: No results found for this basename: AST, ALT, ALKPHOS, BILITOT, PROT, ALBUMIN,  in the last 168 hours No results found for this basename: LIPASE, AMYLASE,  in the last 168 hours No results found for this basename: AMMONIA,  in the last 168 hours CBC:  Recent Labs Lab 02/19/13 0400 02/20/13 0414 02/21/13 0330 02/22/13 0657 02/23/13 0409  WBC 21.1* 20.3* 17.8* 21.4* 17.5*  NEUTROABS 18.5* 17.1* 14.2* 16.3* 12.8*  HGB 10.7* 11.4* 10.9* 11.4* 10.8*  HCT 31.4* 34.2* 32.3* 33.4* 31.6*  MCV 95.2 94.5 93.4 93.6 93.5  PLT 384 380 319 302 306   Cardiac Enzymes:  Recent Labs Lab 02/17/13 2228 02/18/13 0241 02/22/13 0547 02/22/13 1245 02/22/13 1809  TROPONINI 1.12* 0.68* 0.34* 0.34* <0.30   BNP (last 3 results)  Recent Labs  02/15/13 0359 02/22/13 0658 02/23/13 0409  PROBNP 1168.0*  3349.0* 3066.0*   CBG:  Recent Labs Lab 02/22/13 2202 02/23/13 0756 02/23/13 1209 02/23/13 1648 02/23/13 2105  GLUCAP 149* 118* 152* 160* 94    Recent Results (from the past 240 hour(s))  CULTURE, BLOOD (ROUTINE X 2)     Status: None   Collection Time    03/16/2013  8:14 PM      Result Value Range Status   Specimen Description BLOOD RIGHT ANTECUBITAL   Final   Special Requests BOTTLES DRAWN AEROBIC AND ANAEROBIC 5CC   Final   Culture  Setup Time     Final   Value: 02/15/2013 02:28     Performed at [facility]   Culture     Final   Value: NO GROWTH 5 DAYS     Performed at [facility]   Report Status 02/21/2013 FINAL   Final  CULTURE, BLOOD (ROUTINE X  2)     Status: None   Collection Time    2013-03-16  8:47 PM      Result Value Range Status   Specimen Description BLOOD LEFT HAND   Final   Special Requests BOTTLES DRAWN AEROBIC AND ANAEROBIC 1.5CC   Final   Culture  Setup Time     Final   Value: 02/15/2013 02:28     Performed at [facility]   Culture     Final   Value: NO GROWTH 5 DAYS     Performed at [facility]   Report Status 02/21/2013 FINAL   Final  MRSA PCR SCREENING     Status: None   Collection Time    2013/03/16 11:00 PM      Result Value Range Status   MRSA by PCR NEGATIVE  NEGATIVE Final   Comment:            The GeneXpert MRSA Assay (FDA     approved for NASAL specimens     only), is one component of a     comprehensive MRSA colonization     surveillance program. It is not     intended to diagnose MRSA     infection nor to guide or     monitor treatment for     MRSA infections.  URINE CULTURE     Status: None   Collection Time    02/15/13  4:49 AM      Result Value Range Status   Specimen Description URINE, RANDOM   Final   Special Requests NONE   Final   Culture  Setup Time     Final   Value: 02/15/2013 15:56     Performed at [facility] Count     Final   Value: NO GROWTH     Performed at [facility]   Culture     Final   Value: NO GROWTH     Performed at [facility]   Report Status 02/16/2013 FINAL   Final  CULTURE, RESPIRATORY (NON-EXPECTORATED)     Status: None   Collection Time    02/17/13 11:25 AM      Result Value Range Status   Specimen Description TRACHEAL ASPIRATE   Final   Special Requests Normal   Final   Gram Stain     Final   Value: ABUNDANT WBC PRESENT,BOTH PMN AND MONONUCLEAR     NO SQUAMOUS EPITHELIAL CELLS SEEN     NO ORGANISMS SEEN     Performed at [facility]  Culture     Final   Value: Non-Pathogenic Oropharyngeal-type Flora Isolated.     Performed at [facility]   Report Status 02/20/2013 FINAL   Final   VIRAL CULTURE VIRC     Status: None   Collection Time    02/17/13 11:25 AM      Result Value Range Status   Specimen Description ENDOTRACHEAL   Final   Special Requests Normal   Final   Culture     Final   Value: No Virus Isolated in Cell Culture                                                                A negative result does not exclude the possibility of virus infection;inappropriate specimen collection,storage and transport may lead to false negative      culture results.     Note:    The current Enterovirus culture system does not detect Enterovirus D68. If Enterovirus D68 is suspected, please call client services to add the test for Enterovirus PCR (Test code 16109).     Performed at [facility]   Report Status 02/23/2013 FINAL   Final  RESPIRATORY VIRUS PANEL     Status: Abnormal   Collection Time    02/17/13 11:25 AM      Result Value Range Status   Source - RVPAN TRACHEAL ASPIRATE   Final   Respiratory Syncytial Virus A NOT DETECTED   Final   Respiratory Syncytial Virus B NOT DETECTED   Final   Influenza A NOT DETECTED   Final   Influenza B NOT DETECTED   Final   Parainfluenza 1 NOT DETECTED   Final   Parainfluenza 2 NOT DETECTED   Final   Parainfluenza 3 NOT DETECTED   Final   Metapneumovirus NOT DETECTED   Final   Rhinovirus DETECTED (*)  Final   Adenovirus NOT DETECTED   Final   Influenza A H1 NOT DETECTED   Final   Influenza A H3 NOT DETECTED   Final   Comment: (NOTE)           Normal Reference Range for each Analyte: NOT DETECTED     Testing performed using the Luminex xTAG Respiratory Viral Panel test     kit.     This test was developed and its performance characteristics determined     by [facility]. It has not been cleared or approved by the [place]     Food and Drug Administration. This test is used for clinical purposes.     It should not be regarded as investigational or for research. This     laboratory is certified under the Clinical  Laboratory Improvement     Amendments of 1988 (CLIA) as qualified to perform high complexity     clinical laboratory testing.     Performed at [facility]  CULTURE, BLOOD (ROUTINE X 2)     Status: None   Collection Time    02/17/13 12:10 PM      Result Value Range Status   Specimen Description BLOOD CENTRAL LINE   Final   Special Requests BOTTLES DRAWN AEROBIC AND ANAEROBIC 15 CC EA   Final   Culture  Setup Time     Final  Value: 02/17/2013 20:22     Performed at [facility]   Culture     Final   Value: NO GROWTH 5 DAYS     Performed at [facility]   Report Status 02/23/2013 FINAL   Final  CULTURE, BLOOD (ROUTINE X 2)     Status: None   Collection Time    02/17/13  1:40 PM      Result Value Range Status   Specimen Description BLOOD LEFT ARM   Final   Special Requests BOTTLES DRAWN AEROBIC AND ANAEROBIC 10CC   Final   Culture  Setup Time     Final   Value: 02/17/2013 17:40     Performed at [facility]   Culture     Final   Value: NO GROWTH 5 DAYS     Performed at [facility]   Report Status 02/23/2013 FINAL   Final  CLOSTRIDIUM DIFFICILE BY PCR     Status: None   Collection Time    02/21/13  5:43 PM      Result Value Range Status   C difficile by pcr NEGATIVE  NEGATIVE Final   Comment: Performed at [facility]     Studies: Dg Chest Port 1 View  02/24/2013   CLINICAL DATA:  Shortness of breath  EXAM: PORTABLE CHEST - 1 VIEW  COMPARISON:  Prior radiograph from 02/22/2013  FINDINGS: Left IJ approach central venous catheter is stable in position. Cardiac silhouette is grossly stable, although partially obscured by extensive lung opacities.  Lungs are mildly hypoinflated. Diffuse patchy and nodular opacities seen involving both lungs are grossly similar as compared to the prior examination. These likely reflects multi focal infiltrates with probable superimposed edema. Small bilateral pleural effusions are present. No  pneumothorax.  Osseous structures and soft tissues are unchanged.  IMPRESSION: Similar appearance of diffuse patchy airspace opacities, likely multi focal infiltrates. Superimposed edema with small bilateral pleural effusions is suspected.   Electronically Signed   By: Rise Mu M.D.   On: 02/24/2013 03:02    Scheduled Meds: . antiseptic oral rinse  15 mL Mouth Rinse QID  . aspirin  81 mg Oral Daily  . atenolol  50 mg Oral BID  . heparin subcutaneous  5,000 Units Subcutaneous Q8H  . insulin aspart  2-6 Units Subcutaneous TID WC  . multivitamin with minerals  1 tablet Oral Daily  . pantoprazole  40 mg Oral BID  . sodium chloride  3 mL Intravenous Q12H   Continuous Infusions:   Principal Problem:   Sepsis Active Problems:   Pneumonia, community acquired   Dehydration   Sinus tachycardia   CAP (community acquired pneumonia)   Acute exacerbation of CHF (congestive heart failure)   Protein-calorie malnutrition, severe    Time spent: 45 minutes. Greater than 50% of this time was spent in direct contact with the patient coordinating care.    Chaya Jan  [facility] Pager [phone]  If 7PM-7AM, please contact night-coverage at [email or web address], password [facility] 02/24/2013, 8:19 AM  LOS: 10 days

## 2013-02-24 NOTE — Progress Notes (Signed)
NUTRITION FOLLOW UP  Intervention:   Provide Ensure Complete BID Encourage PO intake  Nutrition Dx:   Inadequate oral intake related to inability to eat as evidenced by NPO/mechanical ventilation; discontinued  New Nutrition Dx: Inadequate oral intake related to poor appetite as evidenced by pt's report.  New Goal:   Pt to meet >/= 90% of their estimated nutrition needs    Monitor:   Vent status; extubated 12/19 Weight; up 7 lbs from admission Labs; elevated BUN and creatinine, low calcium, low GFR, low hemoglobin   Assessment:   Admitted with shortness of breath x 2 days. Per H&P, pt has not been feeling well since Thanksgiving when she developed a running nose, nonproductive cough, generalized weakness but no fever or difficulty breathing. Intubated 12/17 for respiratory insufficiency and ARDS, extubated 12/19.   RD assessment 12/17: Met with family in room who encouraged RD to talk with pt's niece in waiting room to obtain pt's diet history. She reports pt has had a poor appetite since Thanksgiving with pt only eating around 1 good meal/day. Tried nutritional supplements but did not like them. Thinks she has been losing 10-15 pounds recently. States pt enjoys sweets.   12/23: Pt reports ongoing poor appetite. Pt on clear liquids this morning and per nursing notes pt finished about 25% of breakfast tray. Diet advanced to regular diet for lunch and pt expresses desire for eating solid food at time of visit; ordered lunch.    Height: Ht Readings from Last 1 Encounters:  02/21/13 5\' 6"  (1.676 m)    Weight Status:   Wt Readings from Last 1 Encounters:  02/24/13 157 lb 6.4 oz (71.396 kg)    Re-estimated needs:  Kcal: 1500-1700 Protein: 70-80 grams Fluid: 1.5-1.7 L/day  Skin: +1 RLE and LLE edema; intact  Diet Order: General   Intake/Output Summary (Last 24 hours) at 02/24/13 1327 Last data filed at 02/24/13 0900  Gross per 24 hour  Intake    480 ml  Output   1850 ml   Net  -1370 ml    Last BM: 12/22   Labs:   Recent Labs Lab 02/21/13 0330 02/22/13 0657 02/23/13 0409 02/24/13 0610  NA 134* 136 134* 138  K 4.2 3.7 3.7 3.5  CL 102 105 101 104  CO2 22 22 22 21   BUN 47* 53* 53* 51*  CREATININE 1.19* 1.21* 1.37* 1.39*  CALCIUM 8.1* 8.2* 8.0* 8.0*  MG  --  2.3 2.3  --   PHOS  --  2.6 3.5  --   GLUCOSE 137* 102* 98 83    CBG (last 3)   Recent Labs  02/23/13 2105 02/24/13 0732 02/24/13 1210  GLUCAP 94 87 111*    Scheduled Meds: . antiseptic oral rinse  15 mL Mouth Rinse QID  . aspirin  81 mg Oral Daily  . atenolol  50 mg Oral BID  . furosemide  40 mg Intravenous Q12H  . heparin subcutaneous  5,000 Units Subcutaneous Q8H  . insulin aspart  2-6 Units Subcutaneous TID WC  . multivitamin with minerals  1 tablet Oral Daily  . pantoprazole  40 mg Oral BID  . sodium chloride  3 mL Intravenous Q12H    Continuous Infusions:   Ian Malkin RD, LDN Inpatient Clinical Dietitian Pager: [phone] After Hours Pager: [phone]

## 2013-02-24 NOTE — Significant Event (Signed)
Rapid Response Event Note  Overview: Time Called: 0230 Arrival Time: 0235 Event Type: Respiratory  Initial Focused Assessment:Pt with increased SOB and labored. Increased crackles has had neb treatment without any improvement.    Interventions: MD called morphine and lasix given. Fio2 was increased from 1.5 L to 4L    Event Summary:Dr Deterding called and meds given pt stayed in room   at      at          [place]ev[place] B

## 2013-02-24 NOTE — Progress Notes (Signed)
PT Cancellation Note  Patient Details Name: Alicia Yoder MRN: [id number] DOB: Nov 01, 1922   Cancelled Treatment:    Reason Eval/Treat Not Completed: Medical issues which prohibited therapy--note rapid response called this morning. will hold PT today and check back on tomorrow. Thanks.    Rebeca Alert, MPT Pager: [phone]

## 2013-02-24 NOTE — Progress Notes (Signed)
Patient laying in bed upon entering room. Patient's breathing is labored and oxygen saturation is between 80-831% on 4 liters nasal canula. Respiratory rate is between 30-36 per minute. Patient states " I feel tired." Patient denies any pain. Crackles heard in bilateral bases of lungs and expiratory wheezing. Patient placed on Veni mask at 15L/min and lasix 40 mg given. Oxygen saturation is now 90-91%. NP Schorr notified via text page. NP replied to text page. No new orders placed. Will continue to monitor patient.

## 2013-02-24 NOTE — Progress Notes (Signed)
Patient laying in bed upon entering room.  Patient's breathing is labored and oxygen saturation is between 88-91% on 4 liters nasal canula.  Respiratory rate is between 24-30 per minute.  Patient states " I feel a little better than I did yesterday."  Patient denies any pain.  Crackles heard in bilateral bases of lungs. Dr. Ardyth Harps notified.  New orders received.  Will continue to monitor patient.

## 2013-02-24 NOTE — Progress Notes (Signed)
At 0135 last night patient called RN to come into her room because she had become short of breath. RN checked patient oxygen and it was 82% on 1.5L and respirations were 42. Checked patient's lungs and heard wheezing, so RN gave patient her PRN neb treatment at 0143. After neb treatment, 0205, respirations were at 32 and oxygen stat was 94% on 4L. Respiratory was called and rapid response nurse came up to floor to see patient. Wheezing had gone away, but RN's could hear coarse crackles. RRN ordered a chest xray and it was done at 0249. Then RRN called the doctor and got an ordered for a one time dose of lasix and morphine. RN continues to monitor patient closely. Patient has urinated 400cc since lasix and right now is sleeping with respirations at 28. Will report this off to day nurse and keep patient on 4L for right now.

## 2013-02-24 NOTE — Progress Notes (Signed)
Patient was complaining of not being able to sleep well for the last few days. Doctor was notified and new orders were given for trazadone PRN at bedtime. Will continue to monitor patient.

## 2013-02-25 ENCOUNTER — Inpatient Hospital Stay (HOSPITAL_COMMUNITY): Payer: Medicare Other

## 2013-02-25 LAB — CBC
Hemoglobin: 11.1 g/dL — ABNORMAL LOW (ref 12.0–15.0)
MCHC: 33.5 g/dL (ref 30.0–36.0)
MCV: 94.8 fL (ref 78.0–100.0)
RBC: 3.49 MIL/uL — ABNORMAL LOW (ref 3.87–5.11)
WBC: 17.2 10*3/uL — ABNORMAL HIGH (ref 4.0–10.5)

## 2013-02-25 LAB — GLUCOSE, CAPILLARY
Glucose-Capillary: 124 mg/dL — ABNORMAL HIGH (ref 70–99)
Glucose-Capillary: 175 mg/dL — ABNORMAL HIGH (ref 70–99)
Glucose-Capillary: 129 mg/dL — ABNORMAL HIGH (ref 70–99)
Glucose-Capillary: 128 mg/dL — ABNORMAL HIGH (ref 70–99)

## 2013-02-25 LAB — BASIC METABOLIC PANEL
GFR calc Af Amer: 38 mL/min — ABNORMAL LOW (ref >90)
GFR calc non Af Amer: 33 mL/min — ABNORMAL LOW (ref >90)
Potassium: 3.5 mEq/L (ref 3.5–5.1)
Sodium: 138 mEq/L (ref 135–145)

## 2013-02-25 MED ORDER — FUROSEMIDE 10 MG/ML IJ SOLN
40.0000 mg | Freq: Once | INTRAMUSCULAR | Status: AC
  Administered 2013-02-25: 40 mg via INTRAVENOUS
  Filled 2013-02-25: qty 4

## 2013-02-25 MED ORDER — LEVALBUTEROL HCL 0.63 MG/3ML IN NEBU
0.6300 mg | INHALATION_SOLUTION | Freq: Four times a day (QID) | RESPIRATORY_TRACT | Status: DC
  Administered 2013-02-25 – 2013-02-28 (×12): 0.63 mg via RESPIRATORY_TRACT
  Filled 2013-02-25 (×19): qty 3

## 2013-02-25 MED ORDER — POTASSIUM CHLORIDE CRYS ER 20 MEQ PO TBCR
40.0000 meq | EXTENDED_RELEASE_TABLET | Freq: Once | ORAL | Status: AC
  Administered 2013-02-25: 40 meq via ORAL
  Filled 2013-02-25: qty 2

## 2013-02-25 MED ORDER — FUROSEMIDE 10 MG/ML IJ SOLN: 40.0000 mg | Freq: Once | INTRAMUSCULAR | Status: DC

## 2013-02-25 MED ORDER — MORPHINE SULFATE 2 MG/ML IJ SOLN
2.0000 mg | Freq: Once | INTRAMUSCULAR | Status: AC
  Administered 2013-02-25: 2 mg via INTRAVENOUS
  Filled 2013-02-25: qty 1

## 2013-02-25 MED ORDER — MORPHINE SULFATE 2 MG/ML IJ SOLN
2.0000 mg | INTRAMUSCULAR | Status: DC | PRN
  Administered 2013-02-25 – 2013-02-27 (×3): 2 mg via INTRAVENOUS
  Administered 2013-02-27 (×3): 4 mg via INTRAVENOUS
  Administered 2013-02-28: 2 mg via INTRAVENOUS
  Filled 2013-02-25 (×5): qty 1
  Filled 2013-02-25 (×4): qty 2

## 2013-02-25 NOTE — Progress Notes (Signed)
PT Cancellation Note  Patient Details Name: Alicia Yoder MRN: [id number] DOB: Aug 20, 1922   Cancelled Treatment:    Reason Eval/Treat Not Completed: Medical issues which prohibited therapy.    Rebeca Alert, MPT Pager: [phone]

## 2013-02-25 NOTE — Progress Notes (Signed)
Name: Alicia Yoder MRN: [id number] DOB: 11/04/1922    ADMISSION DATE:  03/08/13 CONSULTATION DATE:  02/17/2013   REFERRING MD :  Linnell Fulling PRIMARY SERVICE: TRH  CHIEF COMPLAINT:  Acute respiratory failure  BRIEF PATIENT DESCRIPTION: 79 y/o female with hypertension admitted on 12/13 with acute hypoxemic respiratory failure after a viral illness. PCCM consulted on 12/16 for worsening respiratory distress, renal failure and hypotension.  LINES / TUBES: 12/16 ETT >>12/19 12/16 L IJ CVL >>   CULTURES: 12/14 urine >> negative 12/13 blood >> negative  12/13 Viral PCR (nasal) negative >>Rhinivirus + 12/16 blood >>neg 12/16 BAL >>normal flora 12/16 Viral culture (bal) >>neg  ANTIBIOTICS: 12/13 levaquin >> 12/15 12/16 vanc >>12/19 12/16 cefepime >>12/19 12/16 Tamiflu >>12/18 12/16 azithro >>>12/19 12/19 levaquin> 12/21  SIGNIFICANT EVENTS / STUDIES:  12/13 admission 12/16 PCCM consult 12/16 Bronchoscopy > thin secretions LUL, BAL anteriomedial basal segment LLL 12/19 - extubated. Off pressors 12/20 tx to floor 12-22 triad to pick up 12-23 12-24 tx back to sdu with hypoxia  SUBJECTIVE/OVERNIGHT/INTERVAL HX Tx tro SDU on 70% NRB  VITAL SIGNS: Temp:  [97.1 F (36.2 C)-100.1 F (37.8 C)] 97.1 F (36.2 C) (12/24 0617) Pulse Rate:  [62-102] 62 (12/24 0617) Resp:  [22-48] 48 (12/24 0929) BP: (99-147)/(43-67) 107/64 mmHg (12/24 0617) SpO2:  [77 %-93 %] 87 % (12/24 0929) FiO2 (%):  [50 %] 50 % (12/24 0617) Weight:  [158 lb 4.8 oz (71.804 kg)] 158 lb 4.8 oz (71.804 kg) (12/24 0617)     Vent Mode:  [-]  FiO2 (%):  [50 %] 50 % INTAKE / OUTPUT: Intake/Output     12/23 0701 - 12/24 0700 12/24 0701 - 12/25 0700   P.O. 360    Total Intake(mL/kg) 360 (5)    Urine (mL/kg/hr) 1200 (0.7)    Total Output 1200     Net -840          Urine Occurrence 2 x 2 x     Intake/Output Summary (Last 24 hours) at 02/25/13 1029 Last data filed at 02/25/13 1324  Gross per 24  hour  Intake    120 ml  Output    550 ml  Net   -430 ml    PHYSICAL EXAMINATION:  General: NAD , speech clear  Neuro:  Awake and alert. Oriented   HEENT:  NCAT, PERRL Cardiovascular:  RRR, no murmur, positive gallop, no JVD  Lungs:  Crackles in bases bilaterally, coarse rhonchi throughout  Abdomen:  BS+, soft, nontender Musculoskeletal:  Normal bulk and tone Skin:  No rash or breakdown Ext: warm, no cyanosis or clubbing; trace pretibial edema  LABS:      Recent Labs Lab 02/19/13 0435  PHART 7.386  PCO2ART 36.5  PO2ART 85.7  HCO3 21.4  TCO2 19.8  O2SAT 96.4    CBC  Recent Labs Lab 02/22/13 0657 02/23/13 0409 02/25/13 0545  HGB 11.4* 10.8* 11.1*  HCT 33.4* 31.6* 33.1*  WBC 21.4* 17.5* 17.2*  PLT 302 306 371    COAGULATION No results found for this basename: INR,  in the last 168 hours  CARDIAC    Recent Labs Lab 02/22/13 0547 02/22/13 1245 02/22/13 1809  TROPONINI 0.34* 0.34* <0.30    Recent Labs Lab 02/22/13 0658 02/23/13 0409  PROBNP 3349.0* 3066.0*     CHEMISTRY  Recent Labs Lab 02/21/13 0330 02/22/13 0657 02/23/13 0409 02/24/13 0610 02/25/13 0545  NA 134* 136 134* 138 138  K 4.2 3.7 3.7 3.5 3.5  CL 102 105 101 104 103  CO2 22 22 22 21 23   GLUCOSE 137* 102* 98 83 134*  BUN 47* 53* 53* 51* 46*  CREATININE 1.19* 1.21* 1.37* 1.39* 1.38*  CALCIUM 8.1* 8.2* 8.0* 8.0* 8.0*  MG  --  2.3 2.3  --   --   PHOS  --  2.6 3.5  --   --    Estimated Creatinine Clearance: 28.6 ml/min (by C-G formula based on Cr of 1.38).   LIVER No results found for this basename: AST, ALT, ALKPHOS, BILITOT, PROT, ALBUMIN, INR,  in the last 168 hours   INFECTIOUS No results found for this basename: LATICACIDVEN, PROCALCITON,  in the last 168 hours   ENDOCRINE CBG (last 3)   Recent Labs  02/24/13 1634 02/24/13 2134 02/25/13 0726  GLUCAP 171* 115* 139*         IMAGING x48h  Dg Chest Port 1 View  02/25/2013   CLINICAL DATA:  Dyspnea.   EXAM: PORTABLE CHEST - 1 VIEW  COMPARISON:  February 24, 2013.  FINDINGS: Stable cardiomediastinal silhouette. Severe levoscoliosis of upper thoracic spine is noted. Stable left internal jugular catheter is noted with tip in expected position of the SVC. Stable bilateral lung opacities are noted concerning for pneumonia or edema. No pneumothorax is noted. Narrowing of the right acromiohumeral space is noted suggesting rotator cuff injury on this side.  IMPRESSION: Stable bilateral lung opacities compared to prior exam.   Electronically Signed   By: Roque Lias M.D.   On: 02/25/2013 08:32   Dg Chest Port 1 View  02/24/2013   CLINICAL DATA:  Shortness of breath  EXAM: PORTABLE CHEST - 1 VIEW  COMPARISON:  Prior radiograph from 02/22/2013  FINDINGS: Left IJ approach central venous catheter is stable in position. Cardiac silhouette is grossly stable, although partially obscured by extensive lung opacities.  Lungs are mildly hypoinflated. Diffuse patchy and nodular opacities seen involving both lungs are grossly similar as compared to the prior examination. These likely reflects multi focal infiltrates with probable superimposed edema. Small bilateral pleural effusions are present. No pneumothorax.  Osseous structures and soft tissues are unchanged.  IMPRESSION: Similar appearance of diffuse patchy airspace opacities, likely multi focal infiltrates. Superimposed edema with small bilateral pleural effusions is suspected.   Electronically Signed   By: Rise Mu M.D.   On: 02/24/2013 03:02      ASSESSMENT / PLAN:  PULMONARY A:  Acute hypoxemic respiratory failure due to pneumonia, ARDS Viral PNA +/- bacterial super-infection (NOS) Passed SBT and extubated 12/19 12-24 back to sdu with hypoxia likely fluid overload versus ARDS.  P:   - BiPAP as ordered, no intubation per patient's wishes.  - Wean FIO2 as able. - See ID section - Diuresis   CARDIOVASCULAR A:  Septic shock, weaning off  neo(resolved) NSTEMI >demand ischemia  freq PACs 02/21/13 - off pressors x 24h 12/21 restarted Beta blockers P:  - Tele - ASA sq hep - F/u cardiology as out-pt for stress test  - Rapid taper steroids  Dc on 12-22 - Restart BB - Strongly prefer in this setting: Bystolic,   RENAL A:   AKI > improving Lab Results  Component Value Date   CREATININE 1.38* 02/25/2013   CREATININE 1.39* 02/24/2013   CREATININE 1.37* 02/23/2013    P:   - KVO IVF. - Lasix as ordered. - K replacement as ordered.  GASTROINTESTINAL A:   No acute issues P:   - NPO while on BiPAP  except for medications.  HEMATOLOGIC  Recent Labs Lab 02/22/13 0657 02/23/13 0409 02/25/13 0545  HGB 11.4* 10.8* 11.1*    A:  No acute issues P:  - Monitor cbc - PRBC for hgb </= 6.9gm%    INFECTIOUS A:   Viral PNA (+ rhinovirus) +/- bacterial super-infection (NOS)  P:   - Per dashboard.  ENDOCRINE A:   No acute issues P:   - Monitor glucose. - Off steroids.  NEUROLOGIC A:  Deconditioning s/p critical illness  P:   PT/OT consults placed     Summary: Transferred back to SDU 12-24, made DNR per Dr. Molli Knock. PCCM will sign off.  Brett Canales Minor ACNP Adolph Pollack PCCM Pager [phone] till 3 pm If no answer page [phone] 02/25/2013, 10:29 AM  Spoke with patient and family, made full NCB, will diurese and start BiPAP.  No further escalation at this point.  PCCM will sign off, please call back if needed.  CC time 35 min.  Patient seen and examined, agree with above note.  I dictated the care and orders written for this patient under my direction.  Alyson Reedy, MD [phone]

## 2013-02-25 NOTE — Progress Notes (Signed)
RT placed PT on BiPAP (Ipap 10 / Epap 5 with 50% fi02). Pre BiPAP PT stated she felt that she was not breathing as well as when BiPAP was first discussed with PT (originally she stated she was breathing better), and PT sp02 dropped to 84% on NRB. PT tolerated BiPAP well for approx 10 mins and then stated she was nauseated and may vomit. PT was removed from BiPAP and [placed back on NRB at 15 lpm 02. RN aware.

## 2013-02-25 NOTE — Progress Notes (Signed)
Pt seen for breathing treatment.  RR40 and greater on NRB.  Tx given.  Attempted to place pt on bipap for increased wob and respiratory distress, however pt adamantly refuses.  RN aware at bedside.  Pt placed back on NRB at this time.

## 2013-02-25 NOTE — Progress Notes (Signed)
Pt tachypenic with labored breathing. Crackles and expiratory wheezing bilaterally in all lung fields heard upon ascultation.Pt respirations sustained in the 35-40's per minute. Pt does not seem to be in distress. Respiratory therapy called to look at patient. Respiratory therapist gave breathing treatment. No changes post-treatment.  Patient's current vitals are BP 120/58, temp 97.7, resp 34, O2 92% on Veni mask 50% at 15L/min.  NP on call notified. New orders were placed. Will continue to monitor.

## 2013-02-25 NOTE — Progress Notes (Signed)
Introduced spiritual care as resource and provided support with pt and extended family at bedside.   Royston Cowper, Bronson Ing MDiv

## 2013-02-25 NOTE — Progress Notes (Signed)
Patient ID: Alicia Yoder  female  ZOX:[id number]    DOB: [birth year]    DOA: 02/27/2013  PCP: Lolita Patella, MD  Assessment/Plan: Principal Problem: Acute Hypoxemic Respiratory Failure, Ventilatory Dependant  -secondary to viral PNA +/- superimposed bacterial infection; ARDS.  -Extubated 12/19, rapid response this morning with acute respiratory distress, received Lasix and nebs, chest x-ray shows bilateral lung opacities. Currently on Ventimask, transferred to step down - Continue scheduled Lasix  - Seen by PCCM in SDU, patient started on BiPAP, continue diuresis, PCC and had long discussion with patient and family, currently full no CODE BLUE no intubation or further escalation of care at this point  Septic Shock - resolved -Required pressors.  -Off of stress-dose steroids.   NSTEMI  -Secondary to demand ischemia from sepsis with shock.  -ASA, BB.  -Will need stress as an OP once acute issues resolve.   ARF  -Has had some worsening in the past few days, suspect related to diuresis for pulmonary edema.  -Continue to closely follow.   Deconditioning  -Secondary to critical illness.  -PT/OT evals once respiratory status more stable.   DVT Prophylaxis: Heparin subcutaneous  Code Status: DO NOT RESUSCITATE  Family Communication: Spoke with the family member at the bedside  Disposition:    Subjective: Rapid response morning with acute respiratory distress, the time of my examination, patient placed on Ventimask on 15L  Objective: Weight change: 0.408 kg (14.4 oz)  Intake/Output Summary (Last 24 hours) at 02/25/13 1158 Last data filed at 02/25/13 1100  Gross per 24 hour  Intake    120 ml  Output    790 ml  Net   -670 ml   Blood pressure 107/64, pulse 95, temperature 98.3 F (36.8 C), temperature source Oral, resp. rate 34, height 5\' 6"  (1.676 m), weight 71.804 kg (158 lb 4.8 oz), SpO2 93.00%.  Physical Exam: General: awake, resp distress CVS: S1-S2 clear, no  murmur rubs or gallops Chest: b/l rhonchi Abdomen: soft nontender, nondistended, normal bowel sounds  Extremities: no cyanosis, clubbing or edema noted bilaterally  Lab Results: Basic Metabolic Panel:  Recent Labs Lab 02/23/13 0409 02/24/13 0610 02/25/13 0545  NA 134* 138 138  K 3.7 3.5 3.5  CL 101 104 103  CO2 22 21 23   GLUCOSE 98 83 134*  BUN 53* 51* 46*  CREATININE 1.37* 1.39* 1.38*  CALCIUM 8.0* 8.0* 8.0*  MG 2.3  --   --   PHOS 3.5  --   --    Liver Function Tests: No results found for this basename: AST, ALT, ALKPHOS, BILITOT, PROT, ALBUMIN,  in the last 168 hours No results found for this basename: LIPASE, AMYLASE,  in the last 168 hours No results found for this basename: AMMONIA,  in the last 168 hours CBC:  Recent Labs Lab 02/23/13 0409 02/25/13 0545  WBC 17.5* 17.2*  NEUTROABS 12.8*  --   HGB 10.8* 11.1*  HCT 31.6* 33.1*  MCV 93.5 94.8  PLT 306 371   Cardiac Enzymes:  Recent Labs Lab 02/22/13 0547 02/22/13 1245 02/22/13 1809  TROPONINI 0.34* 0.34* <0.30   BNP: No components found with this basename: POCBNP,  CBG:  Recent Labs Lab 02/24/13 0732 02/24/13 1210 02/24/13 1634 02/24/13 2134 02/25/13 0726  GLUCAP 87 111* 171* 115* 139*     Micro Results: Recent Results (from the past 240 hour(s))  CULTURE, RESPIRATORY (NON-EXPECTORATED)     Status: None   Collection Time    02/17/13 11:25 AM  Result Value Range Status   Specimen Description TRACHEAL ASPIRATE   Final   Special Requests Normal   Final   Gram Stain     Final   Value: ABUNDANT WBC PRESENT,BOTH PMN AND MONONUCLEAR     NO SQUAMOUS EPITHELIAL CELLS SEEN     NO ORGANISMS SEEN     Performed at [facility]   Culture     Final   Value: Non-Pathogenic Oropharyngeal-type Flora Isolated.     Performed at [facility]   Report Status 02/20/2013 FINAL   Final  VIRAL CULTURE VIRC     Status: None   Collection Time    02/17/13 11:25 AM      Result Value Range  Status   Specimen Description ENDOTRACHEAL   Final   Special Requests Normal   Final   Culture     Final   Value: No Virus Isolated in Cell Culture                                                                A negative result does not exclude the possibility of virus infection;inappropriate specimen collection,storage and transport may lead to false negative      culture results.     Note:    The current Enterovirus culture system does not detect Enterovirus D68. If Enterovirus D68 is suspected, please call client services to add the test for Enterovirus PCR (Test code 30865).     Performed at [facility]   Report Status 02/23/2013 FINAL   Final  RESPIRATORY VIRUS PANEL     Status: Abnormal   Collection Time    02/17/13 11:25 AM      Result Value Range Status   Source - RVPAN TRACHEAL ASPIRATE   Final   Respiratory Syncytial Virus A NOT DETECTED   Final   Respiratory Syncytial Virus B NOT DETECTED   Final   Influenza A NOT DETECTED   Final   Influenza B NOT DETECTED   Final   Parainfluenza 1 NOT DETECTED   Final   Parainfluenza 2 NOT DETECTED   Final   Parainfluenza 3 NOT DETECTED   Final   Metapneumovirus NOT DETECTED   Final   Rhinovirus DETECTED (*)  Final   Adenovirus NOT DETECTED   Final   Influenza A H1 NOT DETECTED   Final   Influenza A H3 NOT DETECTED   Final   Comment: (NOTE)           Normal Reference Range for each Analyte: NOT DETECTED     Testing performed using the [employer] xTAG Respiratory Viral Panel test     kit.     This test was developed and its performance characteristics determined     by [facility]. It has not been cleared or approved by the [place]     Food and Drug Administration. This test is used for clinical purposes.     It should not be regarded as investigational or for research. This     laboratory is certified under the Clinical Laboratory Improvement     Amendments of 1988 (CLIA) as qualified to perform high complexity     clinical  laboratory testing.     Performed at [facility]  CULTURE, BLOOD (ROUTINE X 2)     Status: None   Collection Time    02/17/13 12:10 PM      Result Value Range Status   Specimen Description BLOOD CENTRAL LINE   Final   Special Requests BOTTLES DRAWN AEROBIC AND ANAEROBIC 15 CC EA   Final   Culture  Setup Time     Final   Value: 02/17/2013 20:22     Performed at [facility]   Culture     Final   Value: NO GROWTH 5 DAYS     Performed at [facility]   Report Status 02/23/2013 FINAL   Final  CULTURE, BLOOD (ROUTINE X 2)     Status: None   Collection Time    02/17/13  1:40 PM      Result Value Range Status   Specimen Description BLOOD LEFT ARM   Final   Special Requests BOTTLES DRAWN AEROBIC AND ANAEROBIC 10CC   Final   Culture  Setup Time     Final   Value: 02/17/2013 17:40     Performed at [facility]   Culture     Final   Value: NO GROWTH 5 DAYS     Performed at [facility]   Report Status 02/23/2013 FINAL   Final  CLOSTRIDIUM DIFFICILE BY PCR     Status: None   Collection Time    02/21/13  5:43 PM      Result Value Range Status   C difficile by pcr NEGATIVE  NEGATIVE Final   Comment: Performed at [facility]    Studies/Results: Dg Chest 2 View  02/04/2013   CLINICAL DATA:  Shortness of breath.  Chest pain.  EXAM: CHEST  2 VIEW  COMPARISON:  None.  FINDINGS: Diffuse bilateral airspace disease is seen, most likely due to diffuse pulmonary edema. Tiny bilateral pleural effusions are pleural thickening also noted. Thoracolumbar scoliosis also noted.  IMPRESSION: Diffuse bilateral airspace disease, likely due to diffuse pulmonary edema. Tiny bilateral pleural effusions versus pleural thickening.   Electronically Signed   By: Myles Rosenthal M.D.   On: 02/27/2013 20:42   Dg Chest Port 1 View  02/25/2013   CLINICAL DATA:  Dyspnea.  EXAM: PORTABLE CHEST - 1 VIEW  COMPARISON:  February 24, 2013.  FINDINGS: Stable cardiomediastinal  silhouette. Severe levoscoliosis of upper thoracic spine is noted. Stable left internal jugular catheter is noted with tip in expected position of the SVC. Stable bilateral lung opacities are noted concerning for pneumonia or edema. No pneumothorax is noted. Narrowing of the right acromiohumeral space is noted suggesting rotator cuff injury on this side.  IMPRESSION: Stable bilateral lung opacities compared to prior exam.   Electronically Signed   By: Roque Lias M.D.   On: 02/25/2013 08:32   Dg Chest Port 1 View  02/24/2013   CLINICAL DATA:  Shortness of breath  EXAM: PORTABLE CHEST - 1 VIEW  COMPARISON:  Prior radiograph from 02/22/2013  FINDINGS: Left IJ approach central venous catheter is stable in position. Cardiac silhouette is grossly stable, although partially obscured by extensive lung opacities.  Lungs are mildly hypoinflated. Diffuse patchy and nodular opacities seen involving both lungs are grossly similar as compared to the prior examination. These likely reflects multi focal infiltrates with probable superimposed edema. Small bilateral pleural effusions are present. No pneumothorax.  Osseous structures and soft tissues are unchanged.  IMPRESSION: Similar appearance of diffuse patchy airspace opacities, likely multi focal infiltrates. Superimposed  edema with small bilateral pleural effusions is suspected.   Electronically Signed   By: Rise Mu M.D.   On: 02/24/2013 03:02   Dg Chest Port 1 View  02/22/2013   CLINICAL DATA:  Respiratory distress  EXAM: PORTABLE CHEST - 1 VIEW  COMPARISON:  02/19/2013  FINDINGS: Interval tracheal and esophageal extubation. Left IJ catheter in stable position.  No significant change in diffuse airspace disease, likely with small effusions. No pneumothorax. Stable heart size. Thoracic levoscoliosis.  IMPRESSION: Unchanged diffuse lung disease compared to 2 days prior, likely a combination of edema and pneumonia.   Electronically Signed   By: Tiburcio Pea M.D.   On: 02/22/2013 06:17   Dg Chest Port 1 View  02/19/2013   CLINICAL DATA:  Re-evaluate endotracheal tube position. Intubated patient.  EXAM: PORTABLE CHEST - 1 VIEW  COMPARISON:  02/18/2013.  FINDINGS: The endotracheal tube tip is 31 mm from the carina. Enteric tube is present doubled back upon itself with the tip at the cardia of the stomach. Left IJ central line is present with the tip in the mid to lower SVC of L1 vertebral body inferior to the carina. There is diffuse left-greater-than-right basilar predominant airspace disease. On the left, this appears slightly worse than on the prior exam, possibly representing rehydration affects on pneumonia. Left pleural effusion is also present.  IMPRESSION: 1. Support apparatus in good position. 2. Slight worsening of airspace disease, greater on the left than right. This is favored to represent a combination of infection and pulmonary edema. 3. Small bilateral pleural effusions.   Electronically Signed   By: Andreas Newport M.D.   On: 02/19/2013 07:12   Dg Chest Port 1 View  02/18/2013   CLINICAL DATA:  Hypoxia  EXAM: PORTABLE CHEST - 1 VIEW  COMPARISON:  February 17, 2013  FINDINGS: Endotracheal tube tip is 3.2 cm above the Carina. Central catheter tip is at the cavoatrial junction. Nasogastric tube tip and side port are in the stomach. No pneumothorax.  There is cardiomegaly. There is widespread interstitial edema bilaterally. There is consolidation in the left lower lobe appears slightly increased compared to 1 day prior.  There is pulmonary venous hypertension. No adenopathy. Scoliosis is stable. There is extensive atherosclerotic change in the aorta. Arthropathy in the right shoulder is stable. There is a questionable subtle impaction in the right proximal humeral metaphysis.  IMPRESSION: Tube and catheter positions as described. No pneumothorax. Evidence of congestive heart failure. There is consolidation in the left base which is increased  compared to 1 day prior. Question superimposed pneumonia in this area.  Subtle sclerosis in the proximal right humeral metaphysis. An impaction injury in this area is questioned.   Electronically Signed   By: Bretta Bang M.D.   On: 02/18/2013 07:21   Dg Chest Port 1 View  02/17/2013   CLINICAL DATA:  Endotracheal tube placement.  EXAM: PORTABLE CHEST - 1 VIEW  COMPARISON:  02/17/2013.  FINDINGS: There is a endotracheal tube with the tip 1.9 cm above the carina. There is a left jugular central venous catheter with the tip projecting over the SVC. There is bilateral interstitial and alveolar airspace opacities throughout the lungs. There are bilateral small pleural effusions. There is no pneumothorax. Stable cardiomediastinal silhouette.  IMPRESSION: Endotracheal tube with the tip 1.9 cm above the carina.  Bilateral interstitial and alveolar airspace opacities throughout the lungs. This may reflect pulmonary edema versus multilobar pneumonia versus ARDS.   Electronically Signed   By:  Elige Ko   On: 02/17/2013 12:51   Dg Chest Port 1 View  02/17/2013   CLINICAL DATA:  Shortness of breath  EXAM: PORTABLE CHEST - 1 VIEW  COMPARISON:  02/15/2013  FINDINGS: No appreciable change in diffuse interstitial and airspace disease with small pleural effusions. No interval lobar collapse, significant fluid increase, or pneumothorax. Stable cardiac enlargement. Upper thoracic levoscoliosis.  IMPRESSION: Unchanged diffuse aeration disturbance, likely a combination of pneumonia and edema. Small pleural effusions.   Electronically Signed   By: Tiburcio Pea M.D.   On: 02/17/2013 04:49   Dg Chest Port 1 View  02/15/2013   CLINICAL DATA:  Shortness of breath history of hypertension  EXAM: PORTABLE CHEST - 1 VIEW  COMPARISON:  02/20/2013  FINDINGS: Diffuse bilateral pulmonary opacities appreciated mid radius confluence in the left lung base. There is blunting of the right and left costophrenic angles. When compared  to the previous study there has been diffuse increase conspicuity of the pulmonary opacities. There is a more could confluent consolidative component in the left hilar region. S-shaped scoliosis identified within the thoracolumbar spine. Degenerative changes appreciated within the right lobe shoulders. Cardiac silhouette is partially obscured though enlarged.  IMPRESSION: Diffuse bilateral pulmonary opacities increased in severity when compared to previous study. These findings likely represent increased pulmonary edema. An accompanying infectious or inflammatory infiltrate cannot be excluded clinically appropriate. There are also findings likely reflecting small bilateral effusions left greater than right. Continued surveillance evaluation recommended.   Electronically Signed   By: Salome Holmes M.D.   On: 02/15/2013 17:16   Dg Abd Portable 1v  02/17/2013   CLINICAL DATA:  Orogastric tube placement.  EXAM: PORTABLE ABDOMEN - 1 VIEW  FINDINGS: Orogastric tube is located in the stomach with its tip at the GE junction. Respiratory motion obscures detail. There is no bowel obstruction. Left lower lobe pulmonary opacity is suspected. Degenerative changes are noted in the spine. Vascular calcification is noted.  IMPRESSION: Orogastric tube is located in the stomach.   Electronically Signed   By: Davonna Belling M.D.   On: 02/17/2013 14:18    Medications: Scheduled Meds: . antiseptic oral rinse  15 mL Mouth Rinse QID  . aspirin  81 mg Oral Daily  . atenolol  50 mg Oral BID  . feeding supplement (ENSURE COMPLETE)  237 mL Oral BID BM  . furosemide  40 mg Intravenous Q12H  . heparin subcutaneous  5,000 Units Subcutaneous Q8H  . levalbuterol  0.63 mg Nebulization Q6H  . multivitamin with minerals  1 tablet Oral Daily  . pantoprazole  40 mg Oral BID  . potassium chloride  40 mEq Oral Once  . sodium chloride  3 mL Intravenous Q12H      LOS: 11 days   Mazal Ebey M.D. [facility] 02/25/2013,  11:58 AM Pager: [phone]  If 7PM-7AM, please contact night-coverage [email or web address] Password [email or web address]

## 2013-02-25 NOTE — Progress Notes (Signed)
Rt discussed with pt the option of using BIPAP to help with her WOB. Pt stated she was fine for now and would like to stay on 100% NRB. Pt may wear BIPAP tonight.

## 2013-02-25 NOTE — Progress Notes (Signed)
Noted Clarification: Note at 17:45 was for 13:00.

## 2013-02-26 LAB — BASIC METABOLIC PANEL
BUN: 42 mg/dL — ABNORMAL HIGH (ref 6–23)
CO2: 24 mEq/L (ref 19–32)
Chloride: 102 mEq/L (ref 96–112)
Creatinine, Ser: 1.32 mg/dL — ABNORMAL HIGH (ref 0.50–1.10)
GFR calc Af Amer: 40 mL/min — ABNORMAL LOW (ref >90)
GFR calc non Af Amer: 35 mL/min — ABNORMAL LOW (ref >90)
Glucose, Bld: 130 mg/dL — ABNORMAL HIGH (ref 70–99)
Potassium: 3.8 mEq/L (ref 3.5–5.1)
Sodium: 139 mEq/L (ref 135–145)

## 2013-02-26 LAB — MAGNESIUM: Magnesium: 2.2 mg/dL (ref 1.5–2.5)

## 2013-02-26 LAB — GLUCOSE, CAPILLARY: Glucose-Capillary: 177 mg/dL — ABNORMAL HIGH (ref 70–99)

## 2013-02-26 LAB — PRO B NATRIURETIC PEPTIDE: Pro B Natriuretic peptide (BNP): 11221 pg/mL — ABNORMAL HIGH (ref 0–450)

## 2013-02-26 MED ORDER — LORAZEPAM 2 MG/ML IJ SOLN
0.5000 mg | Freq: Three times a day (TID) | INTRAMUSCULAR | Status: DC | PRN
  Administered 2013-02-26: 0.5 mg via INTRAVENOUS
  Administered 2013-02-26: 1 mg via INTRAVENOUS
  Administered 2013-02-26 – 2013-02-28 (×2): 0.5 mg via INTRAVENOUS
  Filled 2013-02-26 (×5): qty 1

## 2013-02-26 MED ORDER — SODIUM CHLORIDE 0.9 % IV SOLN
INTRAVENOUS | Status: DC
  Administered 2013-02-27: via INTRAVENOUS

## 2013-02-26 NOTE — Progress Notes (Signed)
HHN given w NRB in place. RR 42 - 45. Sp02 = 93%. Pt adamantly refuses BiPap. Will notify RN. Family in room

## 2013-02-26 NOTE — Progress Notes (Signed)
[facility] PROGRESS NOTE  Tarry Blayney BJY:[id number] DOB: [birth year] DOA: 03/06/13 PCP: Lolita Patella, MD  Assessment/Plan: Acute Hypoxemic Respiratory Failure, Ventilatory Dependant  -secondary to viral PNA +/- superimposed bacterial infection; ARDS. -Extubated 12/19. -Had some respiratory distress overnight on 12/23 and 12/24; CXR still with ASD and superimposed edema. -Continue lasix. She has diuresed about 3 L in the past 3 days, but is still 4L positive since admission. -She is still sustaining RR >35. -She has been refusing BiPap. -Explained to patient that she will probably tire out soon and strongly recommended BiPap. She is willing to try again. -Will give small dose of ativan to help with anxiety.  Septic Shock -Required pressors. -Off of stress-dose steroids.  NSTEMI -Secondary to demand ischemia from sepsis with shock. -ASA, BB. -Will need stress as an OP once acute issues resolve.  ARF -Has had some worsening in the past few days, suspect related to diuresis for pulmonary edema. -Continue to closely follow. -Seems to be remaining stable at around 1.32.  Deconditioning -Secondary to critical illness. -PT/OT evals once respiratory status more stable.  Code Status: Full Code Family Communication: multiple family members at bedside updated on plan of care. Disposition Plan: To be determined   Consultants:  None   Antibiotics:  None   Subjective: Significant WOB today. Sats only 92% on a NRB.   Objective: Filed Vitals:   02/26/13 0536 02/26/13 0700 02/26/13 0800 02/26/13 0900  BP: 106/45     Pulse: 84 81  92  Temp: 98.7 F (37.1 C)  98.5 F (36.9 C)   TempSrc: Oral  Oral   Resp: 28 31  39  Height:      Weight: 70.8 kg (156 lb 1.4 oz)     SpO2: 95% 94%  92%    Intake/Output Summary (Last 24 hours) at 02/26/13 1122 Last data filed at 02/26/13 0600  Gross per 24 hour  Intake    813 ml  Output   1000 ml  Net   -187 ml    Filed Weights   02/24/13 0515 02/25/13 0617 02/26/13 0536  Weight: 71.396 kg (157 lb 6.4 oz) 71.804 kg (158 lb 4.8 oz) 70.8 kg (156 lb 1.4 oz)    Exam:   General:  AA Ox3  Cardiovascular: RRR  Respiratory: Coarse bilateral crackles  Abdomen: S/NT/ND/+BS  Extremities: 3++ edema bilaterally   Neurologic:  Non-focal, generalized weakness  Data Reviewed: Basic Metabolic Panel:  Recent Labs Lab 02/21/13 0330 02/22/13 0657 02/23/13 0409 02/24/13 0610 02/25/13 0545 02/26/13 0345  NA 134* 136 134* 138 138 139  K 4.2 3.7 3.7 3.5 3.5 3.8  CL 102 105 101 104 103 102  CO2 22 22 22 21 23 24   GLUCOSE 137* 102* 98 83 134* 130*  BUN 47* 53* 53* 51* 46* 42*  CREATININE 1.19* 1.21* 1.37* 1.39* 1.38* 1.32*  CALCIUM 8.1* 8.2* 8.0* 8.0* 8.0* 8.3*  MG  --  2.3 2.3  --   --  2.2  PHOS  --  2.6 3.5  --   --  4.2   Liver Function Tests: No results found for this basename: AST, ALT, ALKPHOS, BILITOT, PROT, ALBUMIN,  in the last 168 hours No results found for this basename: LIPASE, AMYLASE,  in the last 168 hours No results found for this basename: AMMONIA,  in the last 168 hours CBC:  Recent Labs Lab 02/20/13 0414 02/21/13 0330 02/22/13 0657 02/23/13 0409 02/25/13 0545  WBC 20.3* 17.8* 21.4* 17.5* 17.2*  NEUTROABS  17.1* 14.2* 16.3* 12.8*  --   HGB 11.4* 10.9* 11.4* 10.8* 11.1*  HCT 34.2* 32.3* 33.4* 31.6* 33.1*  MCV 94.5 93.4 93.6 93.5 94.8  PLT 380 319 302 306 371   Cardiac Enzymes:  Recent Labs Lab 02/22/13 0547 02/22/13 1245 02/22/13 1809  TROPONINI 0.34* 0.34* <0.30   BNP (last 3 results)  Recent Labs  02/22/13 0658 02/23/13 0409 02/26/13 0345  PROBNP 3349.0* 3066.0* 11221.0*   CBG:  Recent Labs Lab 02/25/13 1346 02/25/13 1603 02/25/13 1900 02/25/13 2131 02/26/13 0817  GLUCAP 124* 175* 129* 128* 121*    Recent Results (from the past 240 hour(s))  CULTURE, RESPIRATORY (NON-EXPECTORATED)     Status: None   Collection Time    02/17/13 11:25 AM       Result Value Range Status   Specimen Description TRACHEAL ASPIRATE   Final   Special Requests Normal   Final   Gram Stain     Final   Value: ABUNDANT WBC PRESENT,BOTH PMN AND MONONUCLEAR     NO SQUAMOUS EPITHELIAL CELLS SEEN     NO ORGANISMS SEEN     Performed at [facility]   Culture     Final   Value: Non-Pathogenic Oropharyngeal-type Flora Isolated.     Performed at [facility]   Report Status 02/20/2013 FINAL   Final  VIRAL CULTURE VIRC     Status: None   Collection Time    02/17/13 11:25 AM      Result Value Range Status   Specimen Description ENDOTRACHEAL   Final   Special Requests Normal   Final   Culture     Final   Value: No Virus Isolated in Cell Culture                                                                A negative result does not exclude the possibility of virus infection;inappropriate specimen collection,storage and transport may lead to false negative      culture results.     Note:    The current Enterovirus culture system does not detect Enterovirus D68. If Enterovirus D68 is suspected, please call client services to add the test for Enterovirus PCR (Test code 16109).     Performed at [facility]   Report Status 02/23/2013 FINAL   Final  RESPIRATORY VIRUS PANEL     Status: Abnormal   Collection Time    02/17/13 11:25 AM      Result Value Range Status   Source - RVPAN TRACHEAL ASPIRATE   Final   Respiratory Syncytial Virus A NOT DETECTED   Final   Respiratory Syncytial Virus B NOT DETECTED   Final   Influenza A NOT DETECTED   Final   Influenza B NOT DETECTED   Final   Parainfluenza 1 NOT DETECTED   Final   Parainfluenza 2 NOT DETECTED   Final   Parainfluenza 3 NOT DETECTED   Final   Metapneumovirus NOT DETECTED   Final   Rhinovirus DETECTED (*)  Final   Adenovirus NOT DETECTED   Final   Influenza A H1 NOT DETECTED   Final   Influenza A H3 NOT DETECTED   Final   Comment: (NOTE)  Normal Reference Range for  each Analyte: NOT DETECTED     Testing performed using the Luminex xTAG Respiratory Viral Panel test     kit.     This test was developed and its performance characteristics determined     by [facility]. It has not been cleared or approved by the [place]     Food and Drug Administration. This test is used for clinical purposes.     It should not be regarded as investigational or for research. This     laboratory is certified under the Clinical Laboratory Improvement     Amendments of 1988 (CLIA) as qualified to perform high complexity     clinical laboratory testing.     Performed at [facility]  CULTURE, BLOOD (ROUTINE X 2)     Status: None   Collection Time    02/17/13 12:10 PM      Result Value Range Status   Specimen Description BLOOD CENTRAL LINE   Final   Special Requests BOTTLES DRAWN AEROBIC AND ANAEROBIC 15 CC EA   Final   Culture  Setup Time     Final   Value: 02/17/2013 20:22     Performed at [facility]   Culture     Final   Value: NO GROWTH 5 DAYS     Performed at [facility]   Report Status 02/23/2013 FINAL   Final  CULTURE, BLOOD (ROUTINE X 2)     Status: None   Collection Time    02/17/13  1:40 PM      Result Value Range Status   Specimen Description BLOOD LEFT ARM   Final   Special Requests BOTTLES DRAWN AEROBIC AND ANAEROBIC 10CC   Final   Culture  Setup Time     Final   Value: 02/17/2013 17:40     Performed at [facility]   Culture     Final   Value: NO GROWTH 5 DAYS     Performed at [facility]   Report Status 02/23/2013 FINAL   Final  CLOSTRIDIUM DIFFICILE BY PCR     Status: None   Collection Time    02/21/13  5:43 PM      Result Value Range Status   C difficile by pcr NEGATIVE  NEGATIVE Final   Comment: Performed at [facility]     Studies: Dg Chest Port 1 View  02/25/2013   CLINICAL DATA:  Dyspnea.  EXAM: PORTABLE CHEST - 1 VIEW  COMPARISON:  February 24, 2013.  FINDINGS: Stable  cardiomediastinal silhouette. Severe levoscoliosis of upper thoracic spine is noted. Stable left internal jugular catheter is noted with tip in expected position of the SVC. Stable bilateral lung opacities are noted concerning for pneumonia or edema. No pneumothorax is noted. Narrowing of the right acromiohumeral space is noted suggesting rotator cuff injury on this side.  IMPRESSION: Stable bilateral lung opacities compared to prior exam.   Electronically Signed   By: Roque Lias M.D.   On: 02/25/2013 08:32    Scheduled Meds: . antiseptic oral rinse  15 mL Mouth Rinse QID  . aspirin  81 mg Oral Daily  . atenolol  50 mg Oral BID  . feeding supplement (ENSURE COMPLETE)  237 mL Oral BID BM  . furosemide  40 mg Intravenous Q12H  . heparin subcutaneous  5,000 Units Subcutaneous Q8H  . levalbuterol  0.63 mg Nebulization Q6H  . multivitamin with minerals  1 tablet Oral  Daily  . pantoprazole  40 mg Oral BID  . sodium chloride  3 mL Intravenous Q12H   Continuous Infusions:   Principal Problem:   Sepsis Active Problems:   Pneumonia, community acquired   Dehydration   Sinus tachycardia   CAP (community acquired pneumonia)   Acute exacerbation of CHF (congestive heart failure)   Protein-calorie malnutrition, severe    Time spent: 45 minutes. Greater than 50% of this time was spent in direct contact with the patient coordinating care.    Alicia Yoder  [facility] Pager [phone]  If 7PM-7AM, please contact night-coverage at [email or web address], password [facility] 02/26/2013, 11:22 AM  LOS: 12 days

## 2013-02-27 LAB — GLUCOSE, CAPILLARY: Glucose-Capillary: 124 mg/dL — ABNORMAL HIGH (ref 70–99)

## 2013-02-27 LAB — BASIC METABOLIC PANEL
BUN: 47 mg/dL — ABNORMAL HIGH (ref 6–23)
CO2: 27 mEq/L (ref 19–32)
Chloride: 104 mEq/L (ref 96–112)
Creatinine, Ser: 1.22 mg/dL — ABNORMAL HIGH (ref 0.50–1.10)
Glucose, Bld: 129 mg/dL — ABNORMAL HIGH (ref 70–99)
Potassium: 3.8 mEq/L (ref 3.5–5.1)

## 2013-02-27 LAB — CBC
HCT: 34.7 % — ABNORMAL LOW (ref 36.0–46.0)
Hemoglobin: 11.3 g/dL — ABNORMAL LOW (ref 12.0–15.0)
MCH: 31.4 pg (ref 26.0–34.0)
MCHC: 32.6 g/dL (ref 30.0–36.0)
MCV: 96.4 fL (ref 78.0–100.0)

## 2013-02-27 NOTE — Progress Notes (Signed)
PT Cancellation Note  Patient Details Name: Alicia Yoder MRN: [id number] DOB: 29-Sep-1922   Cancelled Treatment:    Reason Eval/Treat Not Completed: Medical issues which prohibited therapy (on BiPAP/NRM, low BP)   Rada Hay 02/27/2013, 7:30 AM

## 2013-02-27 NOTE — Progress Notes (Signed)
[facility] PROGRESS NOTE  Ramata Strothman ZOX:[id number] DOB: 1922/04/21 DOA: 02/18/2013 PCP: Lolita Patella, MD  Assessment/Plan: Acute Hypoxemic Respiratory Failure, Ventilatory Dependant  -secondary to viral PNA +/- superimposed bacterial infection; ARDS. -Extubated 12/19. -Had some respiratory distress overnight on 12/23 and 12/24; CXR still with ASD and superimposed edema. -Continue lasix. She has diuresed about 4 L in the past 3 days, but is still 3.6L positive since admission. -She is still sustaining RR >35. -She has been intermittently on Bipap but has been getting more confused and sedated due to morphine and ativan boluses. -Discussed with niece at bedside and they seem receptive to a palliative care consult for GOC. Discussed with Dr. Ladona Ridgel.  Septic Shock -Required pressors. -Off of stress-dose steroids.  NSTEMI -Secondary to demand ischemia from sepsis with shock. -ASA, BB. -Will need stress as an OP once acute issues resolve.  ARF -Has had some worsening in the past few days, suspect related to diuresis for pulmonary edema. -Continue to closely follow. -Seems to be remaining stable at around 1.32. Down to 1.22 12/26.  Deconditioning -Secondary to critical illness.  Code Status: DNR Family Communication: niece at bedside updated on plan of care. Disposition Plan: To be determined pending palliative care consultation.   Consultants:  None   Antibiotics:  None   Subjective: Significant WOB today. Sats only 92% on a NRB. Has been placed intermittently on Bipap. But now is more sedated.  Objective: Filed Vitals:   02/27/13 0400 02/27/13 0456 02/27/13 0800 02/27/13 0838  BP: 97/41 97/41 132/47   Pulse: 88 88 95   Temp: 98.7 F (37.1 C)  98.2 F (36.8 C)   TempSrc: Axillary  Axillary   Resp: 30 33 38   Height:      Weight: 68.1 kg (150 lb 2.1 oz)     SpO2: 97% 98% 96% 97%    Intake/Output Summary (Last 24 hours) at 02/27/13  0945 Last data filed at 02/27/13 0900  Gross per 24 hour  Intake    300 ml  Output   1430 ml  Net  -1130 ml   Filed Weights   02/25/13 0617 02/26/13 0536 02/27/13 0400  Weight: 71.804 kg (158 lb 4.8 oz) 70.8 kg (156 lb 1.4 oz) 68.1 kg (150 lb 2.1 oz)    Exam:   General:  Sedated on Bipap.  Cardiovascular: RRR  Respiratory: Coarse bilateral crackles  Abdomen: S/NT/ND/+BS  Extremities: 3++ edema bilaterally   Neurologic:  Non-focal, generalized weakness  Data Reviewed: Basic Metabolic Panel:  Recent Labs Lab 02/21/13 0330 02/22/13 0657 02/23/13 0409 02/24/13 0610 02/25/13 0545 02/26/13 0345 02/27/13 0400  NA 134* 136 134* 138 138 139 142  K 4.2 3.7 3.7 3.5 3.5 3.8 3.8  CL 102 105 101 104 103 102 104  CO2 22 22 22 21 23 24 27   GLUCOSE 137* 102* 98 83 134* 130* 129*  BUN 47* 53* 53* 51* 46* 42* 47*  CREATININE 1.19* 1.21* 1.37* 1.39* 1.38* 1.32* 1.22*  CALCIUM 8.1* 8.2* 8.0* 8.0* 8.0* 8.3* 8.6  MG  --  2.3 2.3  --   --  2.2  --   PHOS  --  2.6 3.5  --   --  4.2  --    Liver Function Tests: No results found for this basename: AST, ALT, ALKPHOS, BILITOT, PROT, ALBUMIN,  in the last 168 hours No results found for this basename: LIPASE, AMYLASE,  in the last 168 hours No results found for this basename: AMMONIA,  in the last 168 hours CBC:  Recent Labs Lab 02/21/13 0330 02/22/13 0657 02/23/13 0409 02/25/13 0545 02/27/13 0400  WBC 17.8* 21.4* 17.5* 17.2* 22.9*  NEUTROABS 14.2* 16.3* 12.8*  --   --   HGB 10.9* 11.4* 10.8* 11.1* 11.3*  HCT 32.3* 33.4* 31.6* 33.1* 34.7*  MCV 93.4 93.6 93.5 94.8 96.4  PLT 319 302 306 371 387   Cardiac Enzymes:  Recent Labs Lab 02/22/13 0547 02/22/13 1245 02/22/13 1809  TROPONINI 0.34* 0.34* <0.30   BNP (last 3 results)  Recent Labs  02/22/13 0658 02/23/13 0409 02/26/13 0345  PROBNP 3349.0* 3066.0* 11221.0*   CBG:  Recent Labs Lab 02/25/13 2131 02/26/13 0817 02/26/13 1241 02/26/13 1658 02/27/13 0748   GLUCAP 128* 121* 177* 126* 118*    Recent Results (from the past 240 hour(s))  CULTURE, RESPIRATORY (NON-EXPECTORATED)     Status: None   Collection Time    02/17/13 11:25 AM      Result Value Range Status   Specimen Description TRACHEAL ASPIRATE   Final   Special Requests Normal   Final   Gram Stain     Final   Value: ABUNDANT WBC PRESENT,BOTH PMN AND MONONUCLEAR     NO SQUAMOUS EPITHELIAL CELLS SEEN     NO ORGANISMS SEEN     Performed at [facility]   Culture     Final   Value: Non-Pathogenic Oropharyngeal-type Flora Isolated.     Performed at [facility]   Report Status 02/20/2013 FINAL   Final  VIRAL CULTURE VIRC     Status: None   Collection Time    02/17/13 11:25 AM      Result Value Range Status   Specimen Description ENDOTRACHEAL   Final   Special Requests Normal   Final   Culture     Final   Value: No Virus Isolated in Cell Culture                                                                A negative result does not exclude the possibility of virus infection;inappropriate specimen collection,storage and transport may lead to false negative      culture results.     Note:    The current Enterovirus culture system does not detect Enterovirus D68. If Enterovirus D68 is suspected, please call client services to add the test for Enterovirus PCR (Test code 96045).     Performed at [facility]   Report Status 02/23/2013 FINAL   Final  RESPIRATORY VIRUS PANEL     Status: Abnormal   Collection Time    02/17/13 11:25 AM      Result Value Range Status   Source - RVPAN TRACHEAL ASPIRATE   Final   Respiratory Syncytial Virus A NOT DETECTED   Final   Respiratory Syncytial Virus B NOT DETECTED   Final   Influenza A NOT DETECTED   Final   Influenza B NOT DETECTED   Final   Parainfluenza 1 NOT DETECTED   Final   Parainfluenza 2 NOT DETECTED   Final   Parainfluenza 3 NOT DETECTED   Final   Metapneumovirus NOT DETECTED   Final   Rhinovirus DETECTED  (*)  Final   Adenovirus NOT DETECTED   Final  Influenza A H1 NOT DETECTED   Final   Influenza A H3 NOT DETECTED   Final   Comment: (NOTE)           Normal Reference Range for each Analyte: NOT DETECTED     Testing performed using the Luminex xTAG Respiratory Viral Panel test     kit.     This test was developed and its performance characteristics determined     by [facility]. It has not been cleared or approved by the [place]     Food and Drug Administration. This test is used for clinical purposes.     It should not be regarded as investigational or for research. This     laboratory is certified under the Clinical Laboratory Improvement     Amendments of 1988 (CLIA) as qualified to perform high complexity     clinical laboratory testing.     Performed at [facility]  CULTURE, BLOOD (ROUTINE X 2)     Status: None   Collection Time    02/17/13 12:10 PM      Result Value Range Status   Specimen Description BLOOD CENTRAL LINE   Final   Special Requests BOTTLES DRAWN AEROBIC AND ANAEROBIC 15 CC EA   Final   Culture  Setup Time     Final   Value: 02/17/2013 20:22     Performed at [facility]   Culture     Final   Value: NO GROWTH 5 DAYS     Performed at [facility]   Report Status 02/23/2013 FINAL   Final  CULTURE, BLOOD (ROUTINE X 2)     Status: None   Collection Time    02/17/13  1:40 PM      Result Value Range Status   Specimen Description BLOOD LEFT ARM   Final   Special Requests BOTTLES DRAWN AEROBIC AND ANAEROBIC 10CC   Final   Culture  Setup Time     Final   Value: 02/17/2013 17:40     Performed at [facility]   Culture     Final   Value: NO GROWTH 5 DAYS     Performed at [facility]   Report Status 02/23/2013 FINAL   Final  CLOSTRIDIUM DIFFICILE BY PCR     Status: None   Collection Time    02/21/13  5:43 PM      Result Value Range Status   C difficile by pcr NEGATIVE  NEGATIVE Final   Comment: Performed at [facility]     Studies: No results found.  Scheduled Meds: . antiseptic oral rinse  15 mL Mouth Rinse QID  . aspirin  81 mg Oral Daily  . atenolol  50 mg Oral BID  . feeding supplement (ENSURE COMPLETE)  237 mL Oral BID BM  . furosemide  40 mg Intravenous Q12H  . heparin subcutaneous  5,000 Units Subcutaneous Q8H  . levalbuterol  0.63 mg Nebulization Q6H  . multivitamin with minerals  1 tablet Oral Daily  . pantoprazole  40 mg Oral BID  . sodium chloride  3 mL Intravenous Q12H   Continuous Infusions: . sodium chloride 20 mL/hr at 02/27/13 0024    Principal Problem:   Sepsis Active Problems:   Pneumonia, community acquired   Dehydration   Sinus tachycardia   CAP (community acquired pneumonia)   Acute exacerbation of CHF (congestive heart failure)   Protein-calorie malnutrition, severe    Time spent: 57  minutes. Greater than 50% of this time was spent in direct contact with the patient coordinating care.    Chaya Jan  [facility] Pager [phone]  If 7PM-7AM, please contact night-coverage at [email or web address], password [facility] 02/27/2013, 9:45 AM  LOS: 13 days

## 2013-02-27 NOTE — Progress Notes (Signed)
Pt currently on NRB and tolerating okay at this point.  RN took Pt off BIPAP around 2230.  RT to monitor and assess as needed.

## 2013-02-28 DIAGNOSIS — Z515 Encounter for palliative care: Secondary | ICD-10-CM

## 2013-02-28 LAB — GLUCOSE, CAPILLARY: Glucose-Capillary: 132 mg/dL — ABNORMAL HIGH (ref 70–99)

## 2013-02-28 MED ORDER — MORPHINE SULFATE 4 MG/ML IJ SOLN
4.0000 mg | Freq: Once | INTRAMUSCULAR | Status: AC
  Administered 2013-02-28: 4 mg via INTRAVENOUS

## 2013-02-28 MED ORDER — LORAZEPAM 2 MG/ML IJ SOLN
1.0000 mg | INTRAMUSCULAR | Status: DC
  Administered 2013-02-28: 1 mg via INTRAVENOUS

## 2013-02-28 MED ORDER — MORPHINE BOLUS VIA INFUSION
1.0000 mg | INTRAVENOUS | Status: DC | PRN
  Filled 2013-02-28: qty 1

## 2013-02-28 MED ORDER — SCOPOLAMINE 1 MG/3DAYS TD PT72
1.0000 | MEDICATED_PATCH | TRANSDERMAL | Status: DC
  Administered 2013-02-28: 1.5 mg via TRANSDERMAL
  Filled 2013-02-28: qty 1

## 2013-02-28 MED ORDER — MORPHINE SULFATE 10 MG/ML IJ SOLN
2.0000 mg/h | INTRAVENOUS | Status: DC
  Filled 2013-02-28: qty 10

## 2013-02-28 MED ORDER — ATROPINE SULFATE 1 % OP SOLN
4.0000 [drp] | OPHTHALMIC | Status: DC | PRN
  Filled 2013-02-28: qty 2

## 2013-02-28 MED ORDER — ACETAMINOPHEN 650 MG RE SUPP: 650.0000 mg | RECTAL | Status: DC | PRN

## 2013-02-28 MED ORDER — SODIUM CHLORIDE 0.9 % IV SOLN
2.0000 mg/h | INTRAVENOUS | Status: DC
  Administered 2013-02-28: 2 mg/h via INTRAVENOUS
  Filled 2013-02-28: qty 10

## 2013-03-05 NOTE — Progress Notes (Signed)
Pt was taken off Bi-Pap at 1400 and passed at 1414. Absence of audible heartbeat verified by 2 RN's; Magdalene Patricia, RN and Flavia Shipper, RN

## 2013-03-05 NOTE — Consult Note (Signed)
Patient [id number] Alicia Yoder      DOB: 10-Apr-1922      NGE:[id number]     Consult Note from the Palliative Medicine Team at [facility]    Consult Requested by:  Dr. Ardyth Yoder      PCP: Alicia Patella, MD Reason for Consultation: GOC     Phone Number:[phone] Symptom management Assessment of patients Current state:Identified two neices as decision makers.  She raised them after their parents died.  Girls state that she does not want this.  They would like to pursue full comfort care.  They are ok with continuous opiates, ativan and removal of bipap when comfortable.  Updated patient's brother at the bedside who was asking if we could just take the fluid off.  I explained her current state and showed him her xrays.  He was in agreement with comfort care.  Plan for comfort care and withdraw of bipap when comfortable on medications.   Goals of Care: 1.  Code Status: DNR/DNI   2. Scope of Treatment:  Comfort care only.  Initiate morphine drip and discontinue bipap.   4. Disposition: expecting hospital death.   3. Symptom Management:   1. Anxiety/Agitation: Scheduled ativan 1 mg q 4 hours 2. Pain/dypnea: morphine drip start at 2 mg, prn bolus with titration of drip for comfort 3. Bowel Regimen: monitor 4. Delirium: secondary to respiratory failure see above treatment 5. Fever: prn tylenol 6. Terminal Secretions: scopalomine, atropine  4. Psychosocial: raised two neices, describes as a strong willed women  5. Spiritual: chaplain support        Patient Documents Completed or Given: Document Given Completed  Advanced Directives Pkt    MOST    DNR    Gone from My Sight 2   Hard Choices      Brief HPI: 80 yr old african [place] female admitted with sepsis, NSTEMI, and now with ards and respiratory failure.  Asked to assist with goc and symptom management   ROS: unable due to sedated.    PMH:  Past Medical History  Diagnosis Date  . HTN (hypertension)       UYQ:[id number] reviewed. No pertinent past surgical history. I have reviewed the FH and SH and  If appropriate update it with new information. No Known Allergies Scheduled Meds: . antiseptic oral rinse  15 mL Mouth Rinse QID  . aspirin  81 mg Oral Daily  . atenolol  50 mg Oral BID  . feeding supplement (ENSURE COMPLETE)  237 mL Oral BID BM  . furosemide  40 mg Intravenous Q12H  . heparin subcutaneous  5,000 Units Subcutaneous Q8H  . levalbuterol  0.63 mg Nebulization Q6H  . multivitamin with minerals  1 tablet Oral Daily  . pantoprazole  40 mg Oral BID  . sodium chloride  3 mL Intravenous Q12H   Continuous Infusions: . sodium chloride 20 mL/hr at 02/27/13 0024   PRN Meds:.acetaminophen, LORazepam, metoprolol, morphine injection, sodium chloride, traZODone    BP 117/38  Pulse 115  Temp(Src) 98.5 F (36.9 C) (Axillary)  Resp 24  Ht 5\' 6"  (1.676 m)  Wt 67.3 kg (148 lb 5.9 oz)  BMI 23.96 kg/m2  SpO2 94%   PPS: 10%   Intake/Output Summary (Last 24 hours) at 02/27/2013 1006 Last data filed at 02/27/2013 0800  Gross per 24 hour  Intake    440 ml  Output    231 ml  Net    209 ml    Physical Exam:  General: Very agitated,  not able to follow commands , on bipap HEENT:  Pupils equal some resistance to exam, bipap in place Chest:   Bilateral course rhonchi, no wheezing CVS: irregular, S1, s2 Abdomen:soft, scaphoid, no grimace, positive bowel sounds Ext: 1 + edema, cool Neuro:confused  Labs: CBC    Component Value Date/Time   WBC 22.9* 02/27/2013 0400   RBC 3.60* 02/27/2013 0400   HGB 11.3* 02/27/2013 0400   HCT 34.7* 02/27/2013 0400   PLT 387 02/27/2013 0400   MCV 96.4 02/27/2013 0400   MCH 31.4 02/27/2013 0400   MCHC 32.6 02/27/2013 0400   RDW 15.6* 02/27/2013 0400   LYMPHSABS 2.3 02/23/2013 0409   MONOABS 1.9* 02/23/2013 0409   EOSABS 0.5 02/23/2013 0409   BASOSABS 0.0 02/23/2013 0409      CMP     Component Value Date/Time   NA 142 02/27/2013 0400    K 3.8 02/27/2013 0400   CL 104 02/27/2013 0400   CO2 27 02/27/2013 0400   GLUCOSE 129* 02/27/2013 0400   BUN 47* 02/27/2013 0400   CREATININE 1.22* 02/27/2013 0400   CALCIUM 8.6 02/27/2013 0400   PROT 6.5 02/24/2013 1950   ALBUMIN 2.2* 02/20/2013 1950   AST 20 02/09/2013 1950   ALT 7 02/16/2013 1950   ALKPHOS 71 02/17/2013 1950   BILITOT 0.3 02/17/2013 1950   GFRNONAA 38* 02/27/2013 0400   GFRAA 44* 02/27/2013 0400    Chest Xray Reviewed/Impressions: bilateral opacity     Time In Time Out Total Time Spent with Patient Total Overall Time  930 am 1015 am 45 min 45 min    Greater than 50%  of this time was spent counseling and coordinating care related to the above assessment and plan.     Alicia Lunsford L. Ladona Ridgel, MD MBA The Palliative Medicine Team at [facility] Phone: [phone] Pager: [phone]

## 2013-03-05 NOTE — Discharge Summary (Signed)
DEATH SUMMARY  Patient was a pleasant 80 y/o woman admitted on 03/01/2013 with acute hypoxemic respiratory failure. She subsequently decompensated and required intubation. She was found to have a viral PNA (+rhinovirus). Was extubated on 02/20/13. During her course in the ICU she developed a NSTEMI, septic shock, pulmonary edema. She subsequently developed repeat respiratory distress requiring transfer back to the ICU. She decided that she did not want to be reintubated. After discussions with family and palliative care it was decided to make her full comfort care and she was placed on a morphine drip. She subsequently expired on 03/05/2013 at 2:14 pm.  Cause Of Death: 1. Acute hypoxemic Respiratory Failure due to Rhinovirus PNA. 2. ARDS 3. Septic Shock 4. Pulmonary edema 5. NSTEMI 6. ARF  Peggye Pitt, MD [facility] Pager: [phone]

## 2013-03-05 NOTE — Progress Notes (Signed)
Patient Alicia Yoder      DOB: Jan 26, 1923      VWU:[id number]  Late entry:  Patient resting comfortably on bipap.  Nursing has been able to strike a balance with Morphine 4 mg q 3 hours.  Family responsible for decision making have been presented earlier with the concept of comfort care per nursing.  They are not at the bedside at present will call and schedule a time to meet asap in the am.    Dreshon Proffit L. Ladona Ridgel, MD MBA The Palliative Medicine Team at [facility] Phone: [phone] Pager: [phone]

## 2013-03-05 DEATH — deceased

## 2015-08-19 IMAGING — CR DG CHEST 2V
2 series · 2 of 2 positions shown · non-contrast
Comparison: None.

CLINICAL DATA: Shortness of breath.  Chest pain.

EXAM:
CHEST  2 VIEW

[w chest pa]
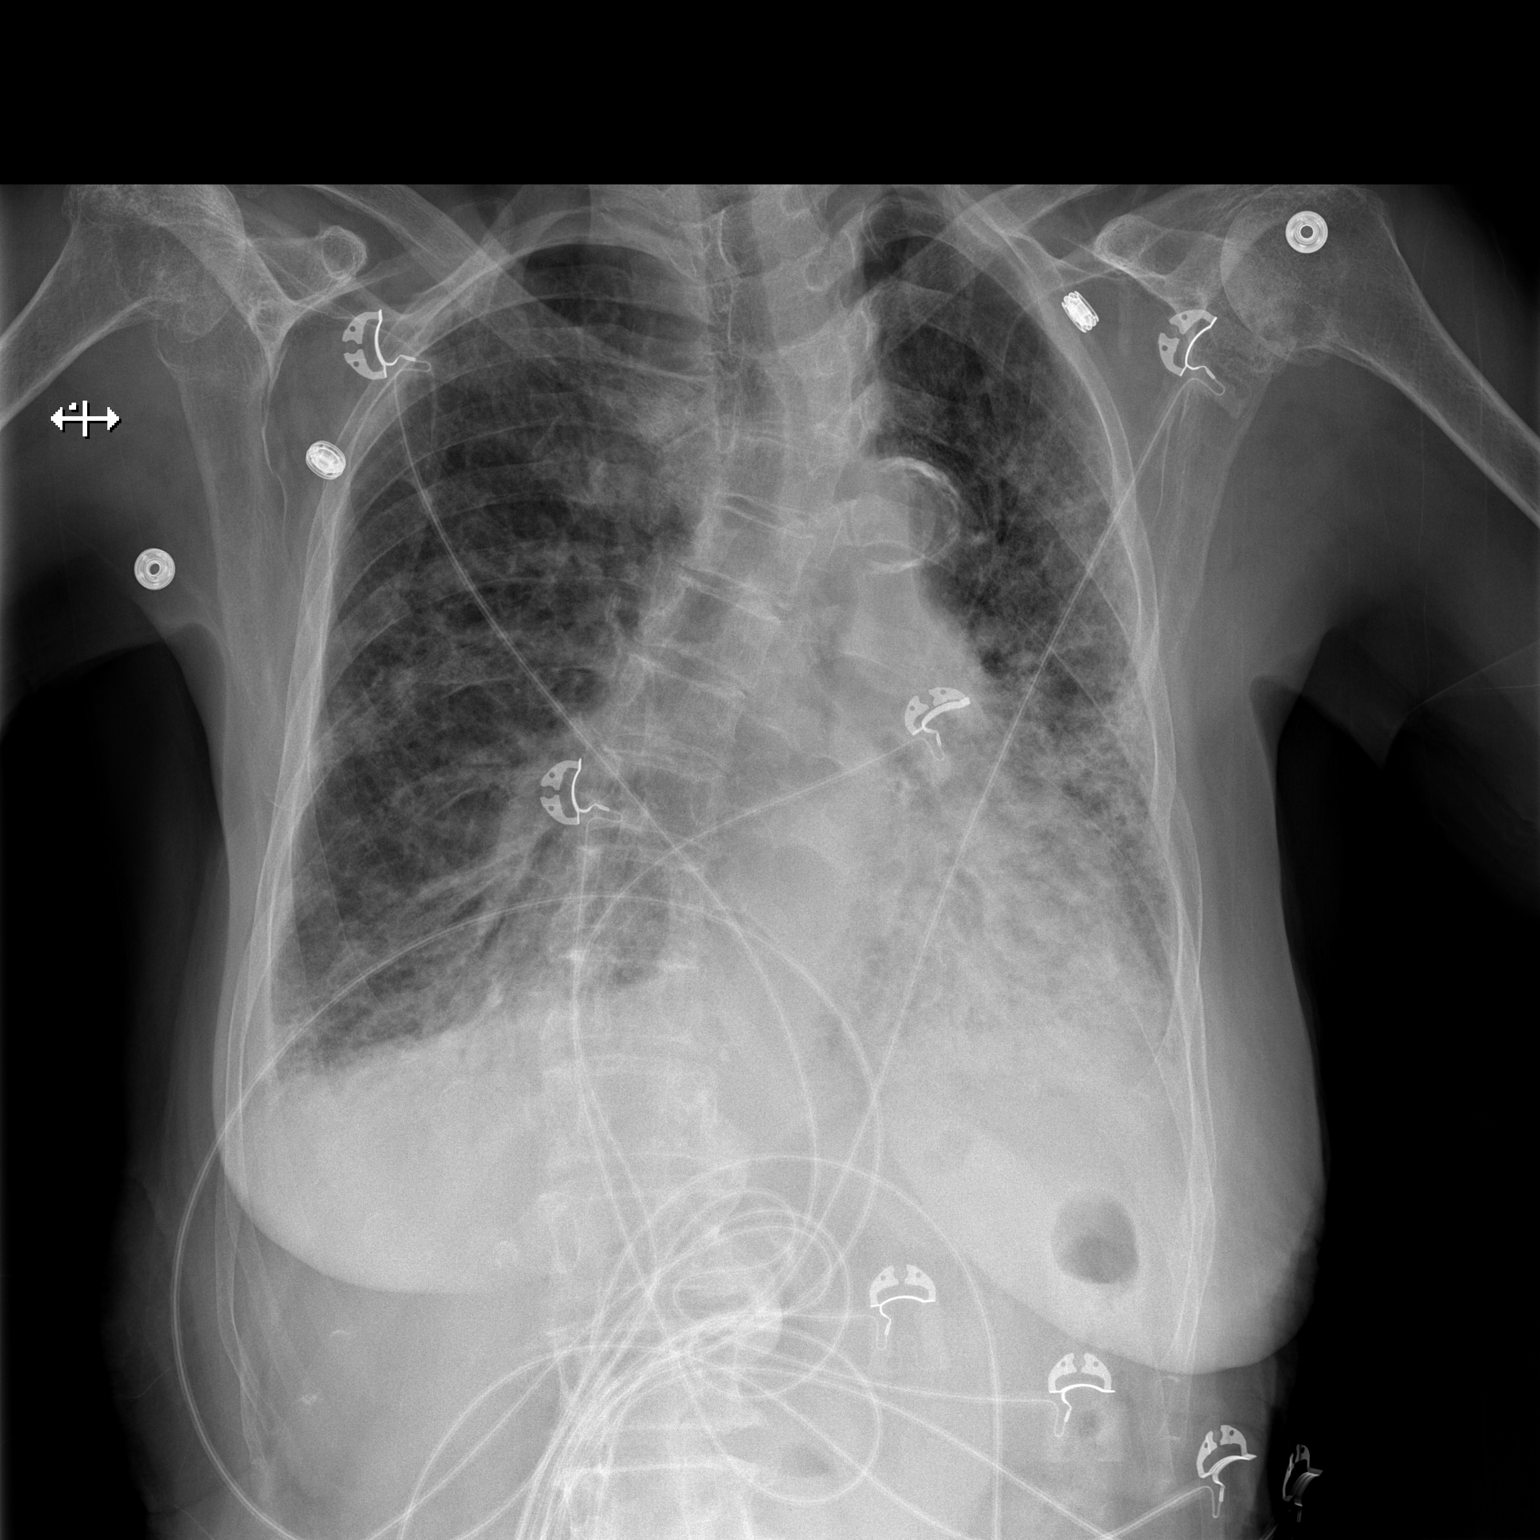

[w chest lat]
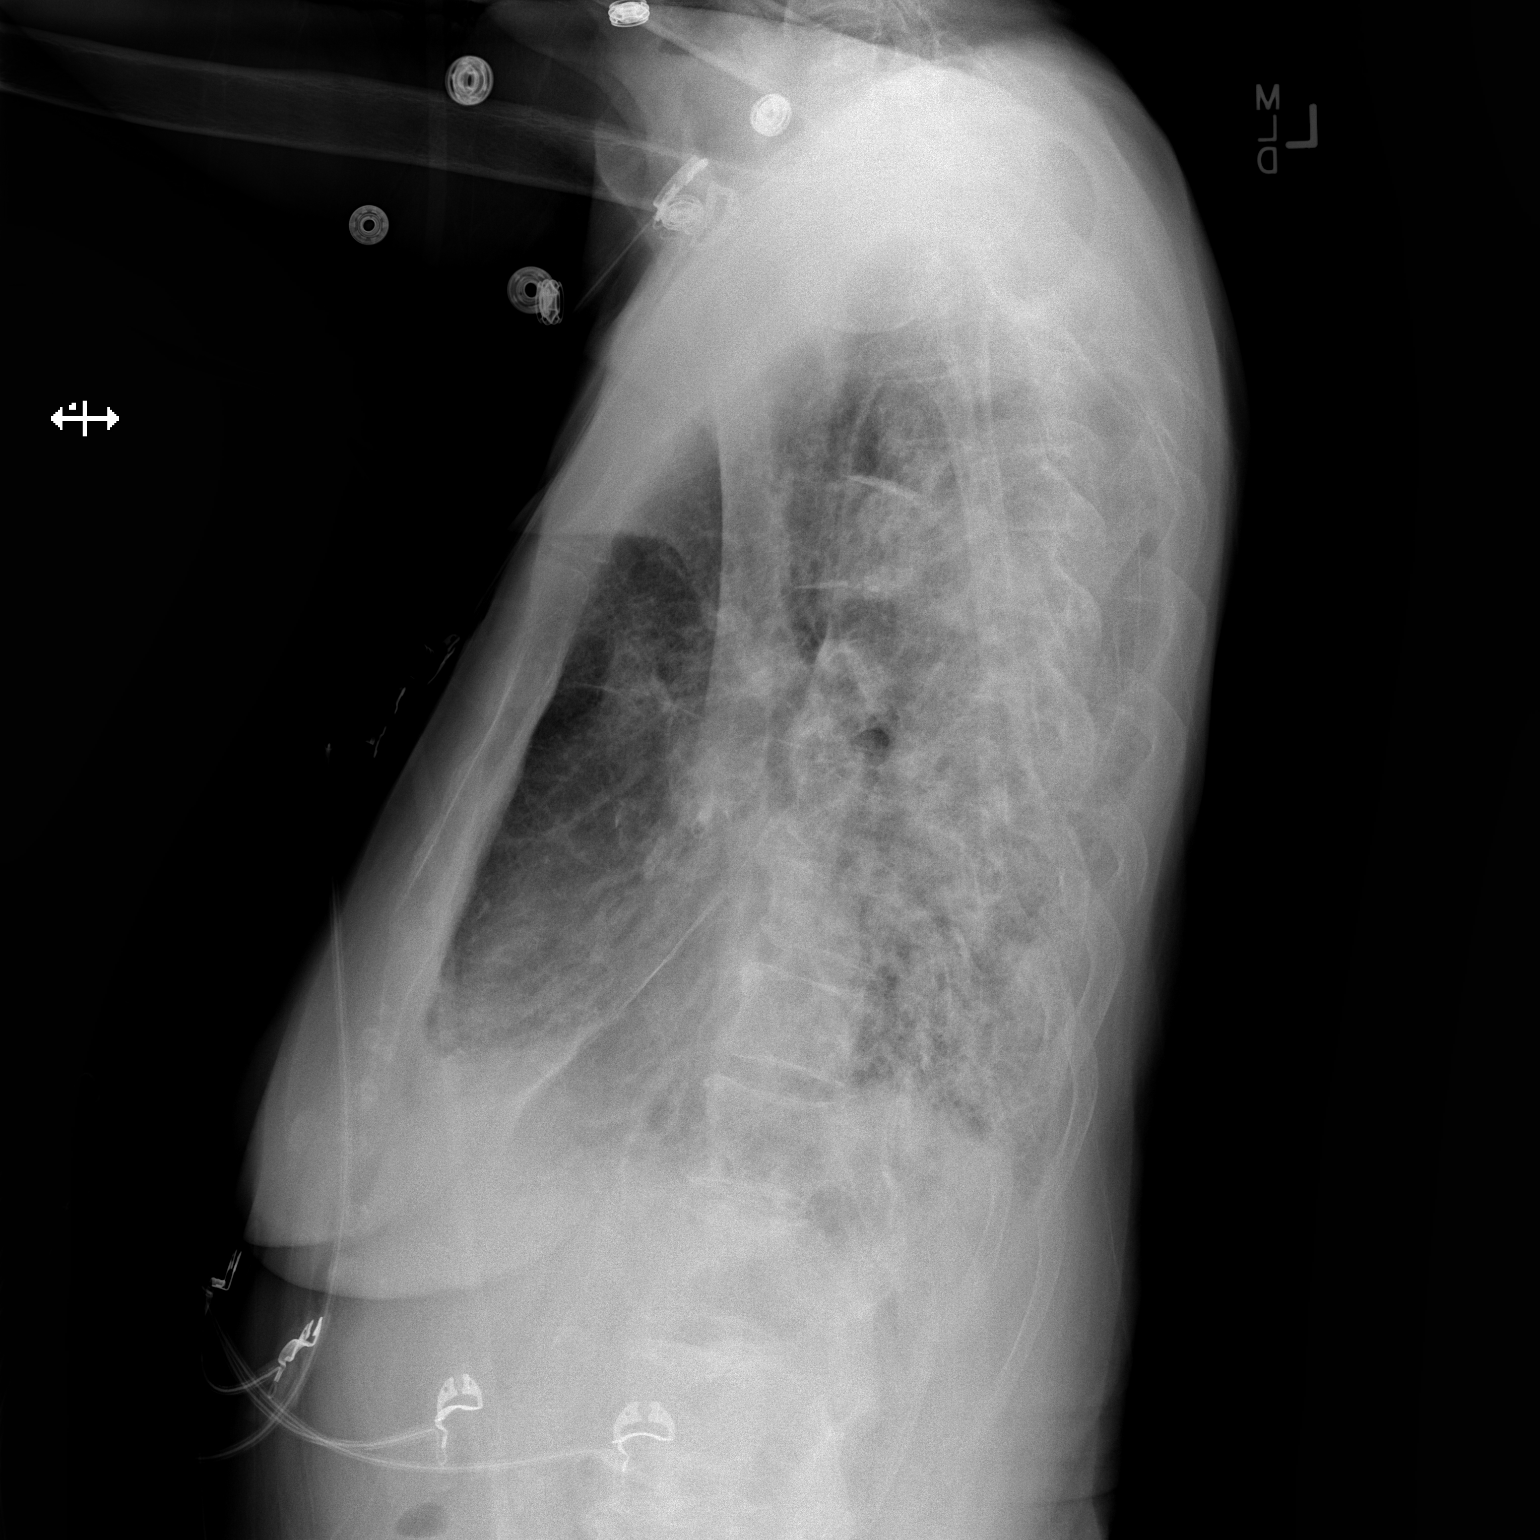

[2 of 2 positions shown; findings below may reference images not displayed]

FINDINGS: Diffuse bilateral airspace disease is seen, most likely due to
diffuse pulmonary edema. Tiny bilateral pleural effusions are
pleural thickening also noted. Thoracolumbar scoliosis also noted.
IMPRESSION: Diffuse bilateral airspace disease, likely due to diffuse pulmonary
edema. Tiny bilateral pleural effusions versus pleural thickening.

## 2015-08-22 IMAGING — CR DG ABD PORTABLE 1V
1 series · 1 of 1 positions shown · non-contrast
Comparison: none

CLINICAL DATA: Orogastric tube placement.

EXAM:
PORTABLE ABDOMEN - 1 VIEW

[ap (kub)]
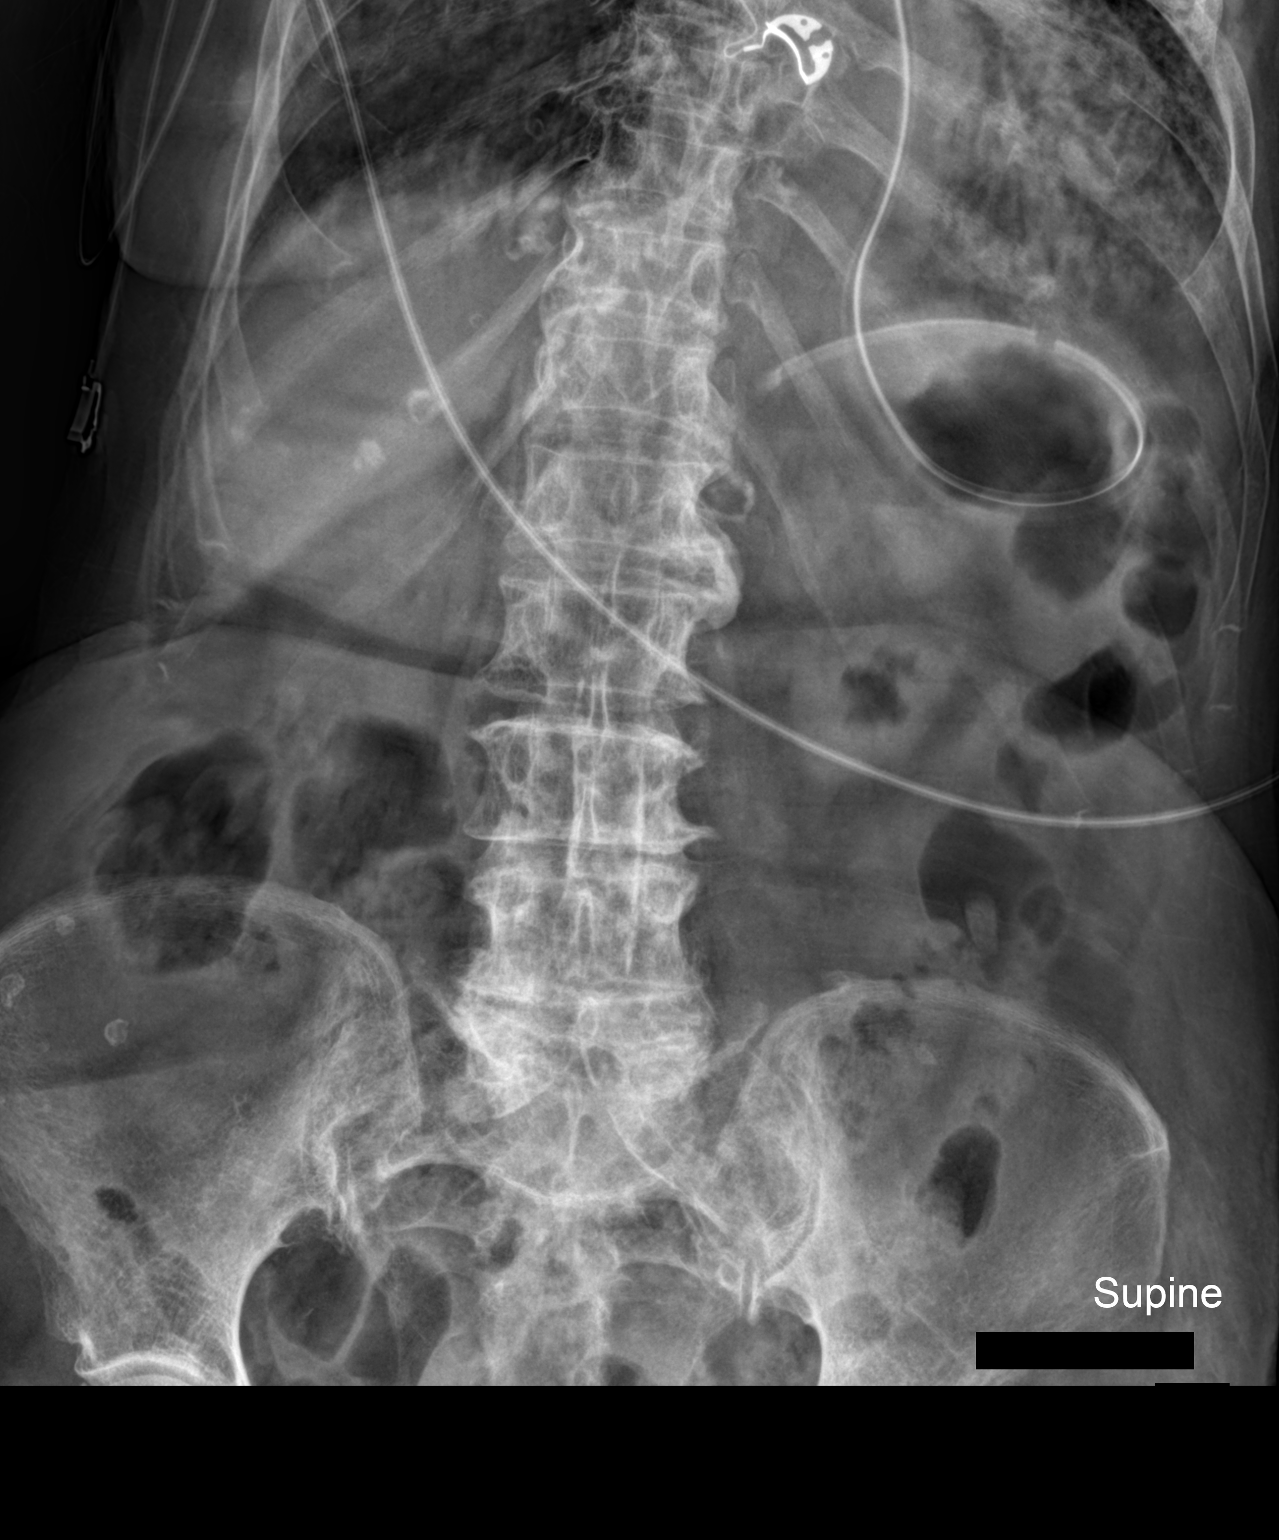

[1 of 1 positions shown; findings below may reference images not displayed]

FINDINGS: Orogastric tube is located in the stomach with its tip at the GE
junction. Respiratory motion obscures detail. There is no bowel
obstruction. Left lower lobe pulmonary opacity is suspected.
Degenerative changes are noted in the spine. Vascular calcification
is noted.
IMPRESSION: Orogastric tube is located in the stomach.

## 2015-08-27 IMAGING — CR DG CHEST 1V PORT
1 series · 1 of 1 positions shown · non-contrast
Comparison: 02/19/2013

CLINICAL DATA: Respiratory distress

EXAM:
PORTABLE CHEST - 1 VIEW

[AP]
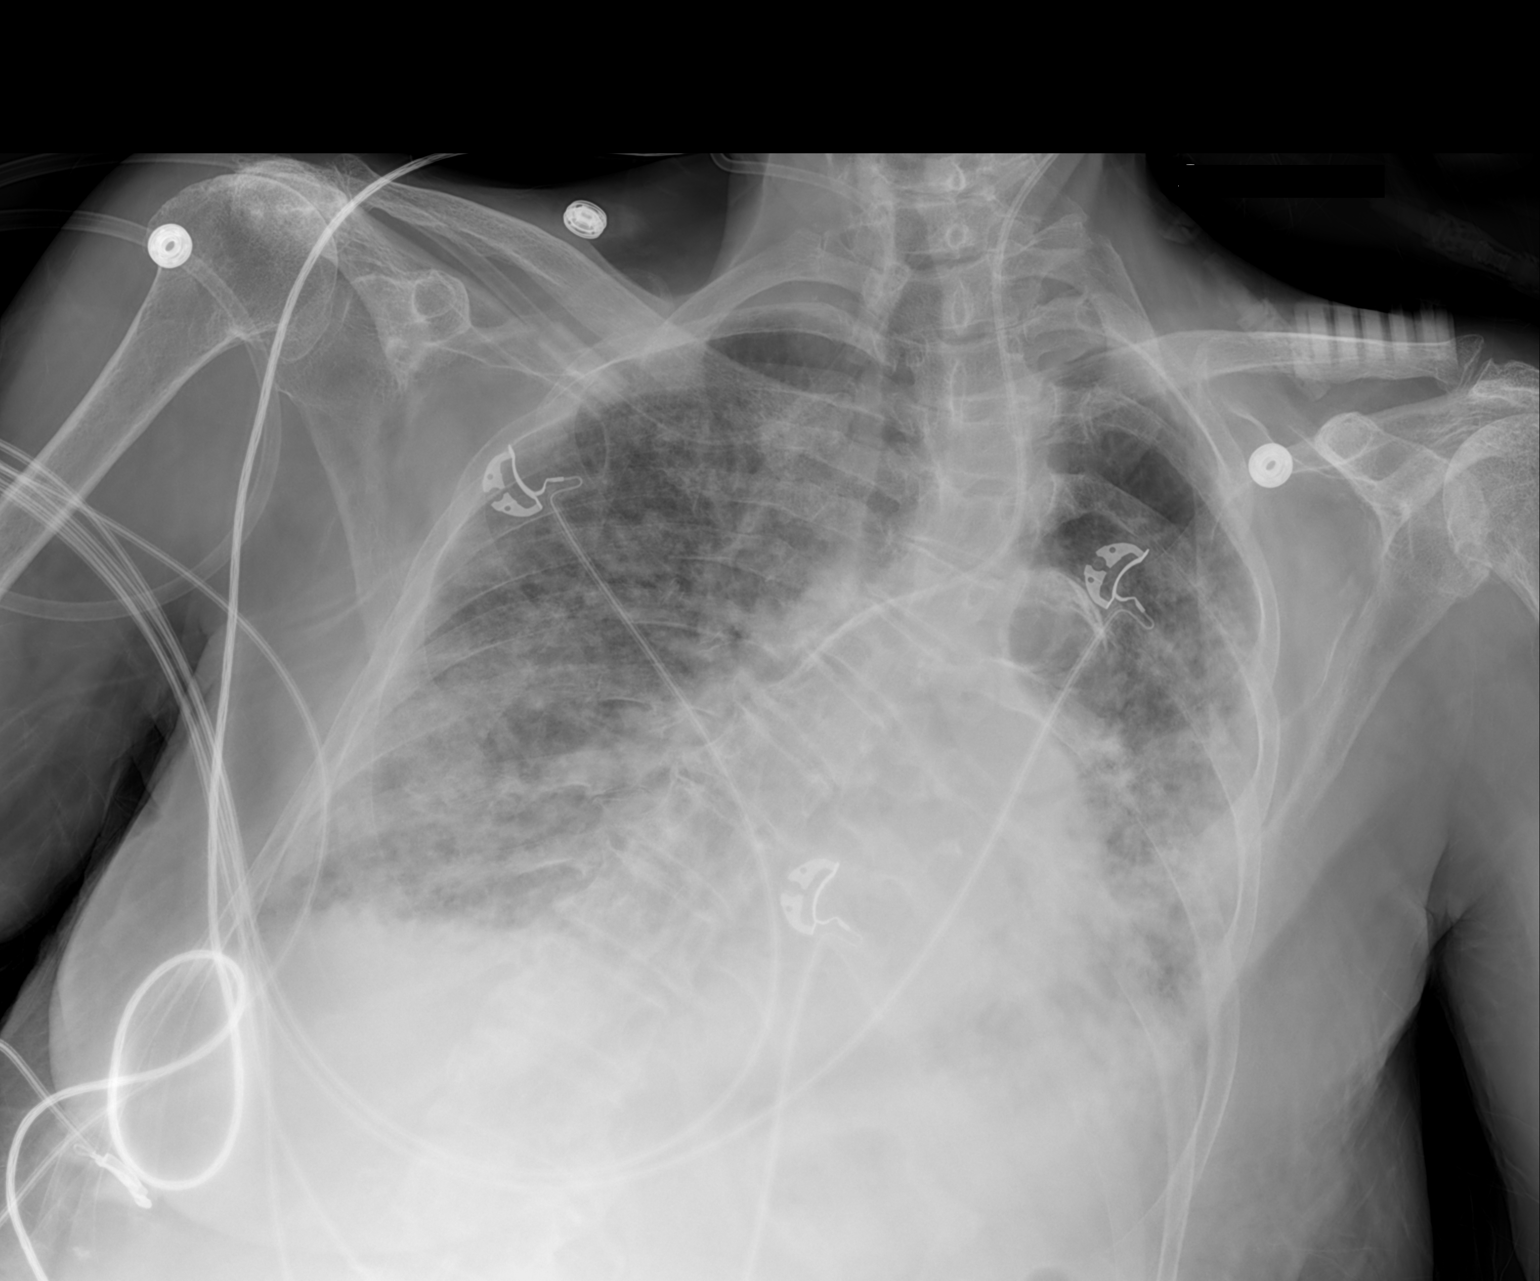

[1 of 1 positions shown; findings below may reference images not displayed]

FINDINGS: Interval tracheal and esophageal extubation. Left IJ catheter in
stable position.

No significant change in diffuse airspace disease, likely with small
effusions. No pneumothorax. Stable heart size. Thoracic
levoscoliosis.
IMPRESSION: Unchanged diffuse lung disease compared to 2 days prior, likely a
combination of edema and pneumonia.
